# Patient Record
Sex: Female | Born: 1975 | Race: Black or African American | Hispanic: No | Marital: Single | Smoking: Never smoker
Health system: Southern US, Community
[De-identification: ages and names within clinical notes are randomized; demographics above are authoritative.]

## PROBLEM LIST (undated history)

## (undated) DIAGNOSIS — R252 Cramp and spasm: Secondary | ICD-10-CM

## (undated) DIAGNOSIS — L309 Dermatitis, unspecified: Secondary | ICD-10-CM

## (undated) DIAGNOSIS — K219 Gastro-esophageal reflux disease without esophagitis: Secondary | ICD-10-CM

## (undated) DIAGNOSIS — D649 Anemia, unspecified: Secondary | ICD-10-CM

## (undated) DIAGNOSIS — G629 Polyneuropathy, unspecified: Secondary | ICD-10-CM

## (undated) DIAGNOSIS — R87629 Unspecified abnormal cytological findings in specimens from vagina: Secondary | ICD-10-CM

## (undated) DIAGNOSIS — M2142 Flat foot [pes planus] (acquired), left foot: Secondary | ICD-10-CM

## (undated) DIAGNOSIS — R569 Unspecified convulsions: Secondary | ICD-10-CM

## (undated) DIAGNOSIS — M2141 Flat foot [pes planus] (acquired), right foot: Secondary | ICD-10-CM

## (undated) DIAGNOSIS — M545 Low back pain, unspecified: Secondary | ICD-10-CM

## (undated) DIAGNOSIS — I10 Essential (primary) hypertension: Secondary | ICD-10-CM

## (undated) DIAGNOSIS — F419 Anxiety disorder, unspecified: Secondary | ICD-10-CM

## (undated) DIAGNOSIS — T7840XA Allergy, unspecified, initial encounter: Secondary | ICD-10-CM

## (undated) DIAGNOSIS — M1990 Unspecified osteoarthritis, unspecified site: Secondary | ICD-10-CM

## (undated) HISTORY — DX: Gastro-esophageal reflux disease without esophagitis: K21.9

## (undated) HISTORY — DX: Unspecified convulsions: R56.9

## (undated) HISTORY — DX: Cramp and spasm: R25.2

## (undated) HISTORY — DX: Low back pain: M54.5

## (undated) HISTORY — DX: Unspecified osteoarthritis, unspecified site: M19.90

## (undated) HISTORY — DX: Dermatitis, unspecified: L30.9

## (undated) HISTORY — DX: Low back pain, unspecified: M54.50

## (undated) HISTORY — DX: Flat foot (pes planus) (acquired), right foot: M21.41

## (undated) HISTORY — DX: Polyneuropathy, unspecified: G62.9

## (undated) HISTORY — PX: BREAST BIOPSY: SHX20

## (undated) HISTORY — PX: ABDOMINAL HYSTERECTOMY: SHX81

## (undated) HISTORY — DX: Unspecified abnormal cytological findings in specimens from vagina: R87.629

## (undated) HISTORY — DX: Allergy, unspecified, initial encounter: T78.40XA

## (undated) HISTORY — PX: SPINE SURGERY: SHX786

## (undated) HISTORY — PX: LAPAROSCOPIC TOTAL HYSTERECTOMY: SUR800

## (undated) HISTORY — DX: Anemia, unspecified: D64.9

## (undated) HISTORY — DX: Anxiety disorder, unspecified: F41.9

## (undated) HISTORY — DX: Flat foot (pes planus) (acquired), right foot: M21.42

## (undated) HISTORY — PX: TOTAL ABDOMINAL HYSTERECTOMY: SHX209

## (undated) HISTORY — DX: Essential (primary) hypertension: I10

---

## 1998-12-05 ENCOUNTER — Emergency Department (HOSPITAL_COMMUNITY): Admission: EM | Admit: 1998-12-05 | Discharge: 1998-12-05 | Payer: Self-pay | Admitting: Emergency Medicine

## 1999-09-01 ENCOUNTER — Emergency Department (HOSPITAL_COMMUNITY): Admission: EM | Admit: 1999-09-01 | Discharge: 1999-09-01 | Payer: Self-pay | Admitting: Emergency Medicine

## 1999-09-01 ENCOUNTER — Encounter: Payer: Self-pay | Admitting: Emergency Medicine

## 1999-09-14 ENCOUNTER — Encounter: Admission: RE | Admit: 1999-09-14 | Discharge: 1999-09-14 | Payer: Self-pay | Admitting: Hematology and Oncology

## 1999-09-28 ENCOUNTER — Encounter: Admission: RE | Admit: 1999-09-28 | Discharge: 1999-09-28 | Payer: Self-pay | Admitting: Hematology and Oncology

## 1999-10-12 ENCOUNTER — Encounter: Admission: RE | Admit: 1999-10-12 | Discharge: 1999-10-12 | Payer: Self-pay | Admitting: Internal Medicine

## 1999-11-11 ENCOUNTER — Encounter: Admission: RE | Admit: 1999-11-11 | Discharge: 1999-11-11 | Payer: Self-pay | Admitting: Internal Medicine

## 1999-12-01 ENCOUNTER — Encounter: Payer: Self-pay | Admitting: Emergency Medicine

## 1999-12-01 ENCOUNTER — Emergency Department (HOSPITAL_COMMUNITY): Admission: EM | Admit: 1999-12-01 | Discharge: 1999-12-01 | Payer: Self-pay

## 1999-12-02 ENCOUNTER — Encounter: Admission: RE | Admit: 1999-12-02 | Discharge: 1999-12-02 | Payer: Self-pay | Admitting: Hematology and Oncology

## 2002-01-11 ENCOUNTER — Emergency Department (HOSPITAL_COMMUNITY): Admission: EM | Admit: 2002-01-11 | Discharge: 2002-01-11 | Payer: Self-pay | Admitting: Emergency Medicine

## 2002-01-11 ENCOUNTER — Encounter: Payer: Self-pay | Admitting: Emergency Medicine

## 2002-11-15 ENCOUNTER — Emergency Department (HOSPITAL_COMMUNITY): Admission: EM | Admit: 2002-11-15 | Discharge: 2002-11-16 | Payer: Self-pay | Admitting: Emergency Medicine

## 2004-04-11 ENCOUNTER — Emergency Department (HOSPITAL_COMMUNITY): Admission: EM | Admit: 2004-04-11 | Discharge: 2004-04-11 | Payer: Self-pay | Admitting: Emergency Medicine

## 2005-02-03 ENCOUNTER — Ambulatory Visit: Payer: Self-pay | Admitting: Family Medicine

## 2005-03-09 ENCOUNTER — Ambulatory Visit: Payer: Self-pay | Admitting: Family Medicine

## 2005-03-14 ENCOUNTER — Ambulatory Visit: Payer: Self-pay | Admitting: Family Medicine

## 2005-07-13 ENCOUNTER — Emergency Department (HOSPITAL_COMMUNITY): Admission: EM | Admit: 2005-07-13 | Discharge: 2005-07-13 | Payer: Self-pay | Admitting: *Deleted

## 2005-07-23 ENCOUNTER — Emergency Department (HOSPITAL_COMMUNITY): Admission: EM | Admit: 2005-07-23 | Discharge: 2005-07-23 | Payer: Self-pay | Admitting: Emergency Medicine

## 2006-08-30 ENCOUNTER — Ambulatory Visit: Payer: Self-pay | Admitting: Family Medicine

## 2006-08-31 ENCOUNTER — Ambulatory Visit: Payer: Self-pay | Admitting: *Deleted

## 2007-01-31 ENCOUNTER — Encounter (INDEPENDENT_AMBULATORY_CARE_PROVIDER_SITE_OTHER): Payer: Self-pay | Admitting: *Deleted

## 2007-04-27 ENCOUNTER — Telehealth (INDEPENDENT_AMBULATORY_CARE_PROVIDER_SITE_OTHER): Payer: Self-pay | Admitting: *Deleted

## 2007-05-09 ENCOUNTER — Ambulatory Visit: Payer: Self-pay | Admitting: Nurse Practitioner

## 2007-05-09 DIAGNOSIS — M545 Low back pain, unspecified: Secondary | ICD-10-CM | POA: Insufficient documentation

## 2007-05-09 DIAGNOSIS — K219 Gastro-esophageal reflux disease without esophagitis: Secondary | ICD-10-CM | POA: Insufficient documentation

## 2007-05-09 DIAGNOSIS — L259 Unspecified contact dermatitis, unspecified cause: Secondary | ICD-10-CM | POA: Insufficient documentation

## 2007-05-09 DIAGNOSIS — G589 Mononeuropathy, unspecified: Secondary | ICD-10-CM | POA: Insufficient documentation

## 2007-05-09 LAB — CONVERTED CEMR LAB
Bilirubin Urine: NEGATIVE
Blood in Urine, dipstick: NEGATIVE
Glucose, Urine, Semiquant: NEGATIVE
Ketones, urine, test strip: NEGATIVE
Nitrite: NEGATIVE
Protein, U semiquant: NEGATIVE
Specific Gravity, Urine: >=1.03
Urobilinogen, UA: 0.2
pH: 5

## 2007-06-18 ENCOUNTER — Ambulatory Visit: Payer: Self-pay | Admitting: Nurse Practitioner

## 2007-06-18 DIAGNOSIS — R252 Cramp and spasm: Secondary | ICD-10-CM | POA: Insufficient documentation

## 2007-06-18 DIAGNOSIS — J45909 Unspecified asthma, uncomplicated: Secondary | ICD-10-CM | POA: Insufficient documentation

## 2007-06-18 LAB — CONVERTED CEMR LAB
Albumin: 4.4 g/dL (ref 3.5–5.2)
Alkaline Phosphatase: 58 units/L (ref 39–117)
BUN: 10 mg/dL (ref 6–23)
CO2: 21 meq/L (ref 19–32)
Calcium: 9.2 mg/dL (ref 8.4–10.5)
Chloride: 105 meq/L (ref 96–112)
Eosinophils Absolute: 0.2 10*3/uL (ref 0.0–0.7)
Glucose, Bld: 105 mg/dL — ABNORMAL HIGH (ref 70–99)
Lymphs Abs: 1.9 10*3/uL (ref 0.7–4.0)
MCV: 85.3 fL (ref 78.0–100.0)
Monocytes Relative: 6 % (ref 3–12)
Neutrophils Relative %: 51 % (ref 43–77)
Potassium: 4.2 meq/L (ref 3.5–5.3)
RBC: 4.62 M/uL (ref 3.87–5.11)
TSH: 2.15 microintl units/mL (ref 0.350–5.50)
WBC: 4.9 10*3/uL (ref 4.0–10.5)

## 2007-06-19 ENCOUNTER — Encounter (INDEPENDENT_AMBULATORY_CARE_PROVIDER_SITE_OTHER): Payer: Self-pay | Admitting: Nurse Practitioner

## 2007-07-03 ENCOUNTER — Telehealth (INDEPENDENT_AMBULATORY_CARE_PROVIDER_SITE_OTHER): Payer: Self-pay | Admitting: Nurse Practitioner

## 2007-07-04 ENCOUNTER — Ambulatory Visit: Payer: Self-pay | Admitting: Family Medicine

## 2007-07-04 LAB — CONVERTED CEMR LAB
Bilirubin Urine: NEGATIVE
Glucose, Urine, Semiquant: NEGATIVE
Specific Gravity, Urine: 1.025
pH: 7

## 2007-07-05 ENCOUNTER — Encounter (INDEPENDENT_AMBULATORY_CARE_PROVIDER_SITE_OTHER): Payer: Self-pay | Admitting: Family Medicine

## 2007-08-20 ENCOUNTER — Telehealth (INDEPENDENT_AMBULATORY_CARE_PROVIDER_SITE_OTHER): Payer: Self-pay | Admitting: *Deleted

## 2007-08-22 ENCOUNTER — Ambulatory Visit: Payer: Self-pay | Admitting: Family Medicine

## 2007-08-22 LAB — CONVERTED CEMR LAB
Bilirubin Urine: NEGATIVE
Glucose, Urine, Semiquant: NEGATIVE
Specific Gravity, Urine: >=1.03
WBC Urine, dipstick: NEGATIVE
pH: 5

## 2007-08-28 ENCOUNTER — Ambulatory Visit: Payer: Self-pay | Admitting: Family Medicine

## 2007-08-28 DIAGNOSIS — E669 Obesity, unspecified: Secondary | ICD-10-CM | POA: Insufficient documentation

## 2007-08-28 DIAGNOSIS — R609 Edema, unspecified: Secondary | ICD-10-CM | POA: Insufficient documentation

## 2007-09-04 ENCOUNTER — Ambulatory Visit: Payer: Self-pay | Admitting: Family Medicine

## 2007-11-29 ENCOUNTER — Ambulatory Visit: Payer: Self-pay | Admitting: Nurse Practitioner

## 2007-11-29 DIAGNOSIS — M2140 Flat foot [pes planus] (acquired), unspecified foot: Secondary | ICD-10-CM | POA: Insufficient documentation

## 2007-11-29 LAB — CONVERTED CEMR LAB: Sed Rate: 11 mm/hr (ref 0–22)

## 2007-11-30 ENCOUNTER — Encounter (INDEPENDENT_AMBULATORY_CARE_PROVIDER_SITE_OTHER): Payer: Self-pay | Admitting: Nurse Practitioner

## 2008-04-28 ENCOUNTER — Encounter (INDEPENDENT_AMBULATORY_CARE_PROVIDER_SITE_OTHER): Payer: Self-pay | Admitting: Family Medicine

## 2008-04-28 ENCOUNTER — Ambulatory Visit: Payer: Self-pay | Admitting: Family Medicine

## 2008-04-28 LAB — CONVERTED CEMR LAB
Blood in Urine, dipstick: NEGATIVE
Ketones, urine, test strip: NEGATIVE
Nitrite: NEGATIVE
Protein, U semiquant: NEGATIVE
pH: 5

## 2008-04-29 ENCOUNTER — Encounter (INDEPENDENT_AMBULATORY_CARE_PROVIDER_SITE_OTHER): Payer: Self-pay | Admitting: Family Medicine

## 2008-04-29 LAB — CONVERTED CEMR LAB: GC Probe Amp, Genital: NEGATIVE

## 2008-05-10 ENCOUNTER — Encounter (INDEPENDENT_AMBULATORY_CARE_PROVIDER_SITE_OTHER): Payer: Self-pay | Admitting: Family Medicine

## 2009-01-12 ENCOUNTER — Ambulatory Visit: Payer: Self-pay | Admitting: Nurse Practitioner

## 2009-01-12 LAB — CONVERTED CEMR LAB
Cholesterol, target level: 200 mg/dL
LDL Goal: 160 mg/dL

## 2009-01-13 ENCOUNTER — Encounter (INDEPENDENT_AMBULATORY_CARE_PROVIDER_SITE_OTHER): Payer: Self-pay | Admitting: Nurse Practitioner

## 2009-01-13 LAB — CONVERTED CEMR LAB
ALT: 42 units/L — ABNORMAL HIGH (ref 0–35)
AST: 39 units/L — ABNORMAL HIGH (ref 0–37)
Alkaline Phosphatase: 68 units/L (ref 39–117)
Basophils Absolute: 0 10*3/uL (ref 0.0–0.1)
Basophils Relative: 1 % (ref 0–1)
CO2: 25 meq/L (ref 19–32)
Creatinine, Ser: 0.79 mg/dL (ref 0.40–1.20)
Eosinophils Absolute: 0.1 10*3/uL (ref 0.0–0.7)
HCV Ab: NEGATIVE
Hep A Total Ab: NEGATIVE
Hep B E Ab: NEGATIVE
MCHC: 31.2 g/dL (ref 30.0–36.0)
MCV: 85.3 fL (ref 78.0–100.0)
Monocytes Relative: 7 % (ref 3–12)
Neutro Abs: 2.6 10*3/uL (ref 1.7–7.7)
Neutrophils Relative %: 56 % (ref 43–77)
Platelets: 249 10*3/uL (ref 150–400)
RBC: 4.55 M/uL (ref 3.87–5.11)
Sodium: 139 meq/L (ref 135–145)
TSH: 2.92 microintl units/mL (ref 0.350–4.500)
Total Bilirubin: 0.5 mg/dL (ref 0.3–1.2)
Total Protein: 6.9 g/dL (ref 6.0–8.3)

## 2009-01-21 ENCOUNTER — Encounter (INDEPENDENT_AMBULATORY_CARE_PROVIDER_SITE_OTHER): Payer: Self-pay | Admitting: Nurse Practitioner

## 2009-05-20 ENCOUNTER — Other Ambulatory Visit: Admission: RE | Admit: 2009-05-20 | Discharge: 2009-05-20 | Payer: Self-pay | Admitting: Internal Medicine

## 2009-05-20 ENCOUNTER — Ambulatory Visit: Payer: Self-pay | Admitting: Nurse Practitioner

## 2009-05-20 LAB — CONVERTED CEMR LAB
ALT: 21 units/L (ref 0–35)
AST: 25 units/L (ref 0–37)
CO2: 21 meq/L (ref 19–32)
Chloride: 105 meq/L (ref 96–112)
Cholesterol: 187 mg/dL (ref 0–200)
GC Probe Amp, Genital: NEGATIVE
Glucose, Urine, Semiquant: NEGATIVE
LDL Cholesterol: 129 mg/dL — ABNORMAL HIGH (ref 0–99)
Sodium: 141 meq/L (ref 135–145)
Total Bilirubin: 0.3 mg/dL (ref 0.3–1.2)
Total Protein: 6.5 g/dL (ref 6.0–8.3)
Urobilinogen, UA: 0.2
VLDL: 14 mg/dL (ref 0–40)
WBC Urine, dipstick: NEGATIVE
pH: 5

## 2009-05-21 ENCOUNTER — Encounter (INDEPENDENT_AMBULATORY_CARE_PROVIDER_SITE_OTHER): Payer: Self-pay | Admitting: Nurse Practitioner

## 2009-06-23 ENCOUNTER — Telehealth (INDEPENDENT_AMBULATORY_CARE_PROVIDER_SITE_OTHER): Payer: Self-pay | Admitting: Nurse Practitioner

## 2009-06-29 DIAGNOSIS — R569 Unspecified convulsions: Secondary | ICD-10-CM

## 2009-06-29 HISTORY — DX: Unspecified convulsions: R56.9

## 2009-06-30 ENCOUNTER — Emergency Department (HOSPITAL_COMMUNITY): Admission: EM | Admit: 2009-06-30 | Discharge: 2009-07-01 | Payer: Self-pay | Admitting: Emergency Medicine

## 2009-07-02 ENCOUNTER — Telehealth (INDEPENDENT_AMBULATORY_CARE_PROVIDER_SITE_OTHER): Payer: Self-pay | Admitting: Internal Medicine

## 2009-07-02 ENCOUNTER — Encounter (INDEPENDENT_AMBULATORY_CARE_PROVIDER_SITE_OTHER): Payer: Self-pay | Admitting: Nurse Practitioner

## 2009-07-02 ENCOUNTER — Ambulatory Visit: Payer: Self-pay | Admitting: Internal Medicine

## 2009-07-02 DIAGNOSIS — M76899 Other specified enthesopathies of unspecified lower limb, excluding foot: Secondary | ICD-10-CM | POA: Insufficient documentation

## 2009-07-03 ENCOUNTER — Telehealth (INDEPENDENT_AMBULATORY_CARE_PROVIDER_SITE_OTHER): Payer: Self-pay | Admitting: Internal Medicine

## 2009-07-03 ENCOUNTER — Ambulatory Visit (HOSPITAL_COMMUNITY): Admission: RE | Admit: 2009-07-03 | Discharge: 2009-07-03 | Payer: Self-pay | Admitting: Internal Medicine

## 2009-07-07 ENCOUNTER — Telehealth (INDEPENDENT_AMBULATORY_CARE_PROVIDER_SITE_OTHER): Payer: Self-pay | Admitting: Internal Medicine

## 2009-08-07 ENCOUNTER — Telehealth (INDEPENDENT_AMBULATORY_CARE_PROVIDER_SITE_OTHER): Payer: Self-pay | Admitting: *Deleted

## 2009-08-27 ENCOUNTER — Telehealth (INDEPENDENT_AMBULATORY_CARE_PROVIDER_SITE_OTHER): Payer: Self-pay | Admitting: Nurse Practitioner

## 2009-09-08 ENCOUNTER — Encounter (INDEPENDENT_AMBULATORY_CARE_PROVIDER_SITE_OTHER): Payer: Self-pay | Admitting: *Deleted

## 2009-09-22 ENCOUNTER — Telehealth (INDEPENDENT_AMBULATORY_CARE_PROVIDER_SITE_OTHER): Payer: Self-pay | Admitting: Nurse Practitioner

## 2010-02-15 ENCOUNTER — Emergency Department (HOSPITAL_COMMUNITY): Admission: EM | Admit: 2010-02-15 | Discharge: 2010-02-15 | Payer: Self-pay | Admitting: Emergency Medicine

## 2010-04-28 ENCOUNTER — Encounter (INDEPENDENT_AMBULATORY_CARE_PROVIDER_SITE_OTHER): Payer: Self-pay | Admitting: Nurse Practitioner

## 2010-05-13 ENCOUNTER — Telehealth (INDEPENDENT_AMBULATORY_CARE_PROVIDER_SITE_OTHER): Payer: Self-pay | Admitting: Nurse Practitioner

## 2010-05-14 ENCOUNTER — Ambulatory Visit
Admission: RE | Admit: 2010-05-14 | Discharge: 2010-05-14 | Payer: Self-pay | Source: Home / Self Care | Attending: Nurse Practitioner | Admitting: Nurse Practitioner

## 2010-05-14 LAB — CONVERTED CEMR LAB
Glucose, Urine, Semiquant: NEGATIVE
Nitrite: NEGATIVE
Specific Gravity, Urine: >=1.03
WBC Urine, dipstick: NEGATIVE
pH: 6.5

## 2010-06-17 NOTE — Progress Notes (Signed)
Summary: MEDICINE NOT WORKING FOR HER LEGS  Medications Added REQUIP 1 MG TABS (ROPINIROLE HCL) One tablet by mouth nightly 30 minutes before sleep  **Pharmach - note dose increase**       Phone Note Call from Patient Call back at [phone] CELL   Reason for Call: Talk to Nurse Summary of Call: Linda Porter Beedle SAYS THAT THE REQUIP FOR HER LEGS ARE NOT WORKING, AND SHE IS WONDERING IF IT NEEDS TO BE A HIGHER DOSAGE OR NEEDS TO BE CHANGED TO SOMETHING ELSE. ALSO SHE SAYS HER FEET ARE GOING TO SLEEP EVEN IF SHE IS STANDING UP AND SHE WANTS TO KNOW IF THIS HAS SOMETING TO DO WITH THE MEDICINE. SHE USES [facility]. Initial call taken by: Leodis Rains,  June 23, 2009 3:54 PM  Follow-up for Phone Call        forward to N. Daphine Deutscher, FNP Follow-up by: Levon Hedger,  June 23, 2009 4:13 PM  Additional Follow-up for Phone Call Additional follow up Details #1::        will increase requip to 1mg  so she can take 2 of the tablets she has which is 0.5mg .  I have sent a new Rx for 1mg  to the pharmacy. Numbness is usually not a side effect from the medication however pt has complained of this previously so don't think this is anything new.  This is part of the reason I wanted her to get tested by neurology Additional Follow-up by: Lehman Prom FNP,  June 24, 2009 12:41 PM    Additional Follow-up for Phone Call Additional follow up Details #2::    Levon Hedger  June 25, 2009 11:40 AM Left message on machine for pt to return call to the office  pt informed of above information and would like to be referred to Neurology if the cost is not to expensive. Levon Hedger  June 25, 2009 11:54 AM   Additional Follow-up for Phone Call Additional follow up Details #3:: Details for Additional Follow-up Action Taken: neurology referral in basket fax to [facility] Additional Follow-up by: Lehman Prom FNP,  June 25, 2009 5:56 PM  New/Updated Medications: REQUIP 1 MG  TABS (ROPINIROLE HCL) One tablet by mouth nightly 30 minutes before sleep  **Pharmach - note dose increase** Prescriptions: REQUIP 1 MG TABS (ROPINIROLE HCL) One tablet by mouth nightly 30 minutes before sleep  **Pharmach - note dose increase**  #30 x 1   Entered and Authorized by:   Lehman Prom FNP   Signed by:   Lehman Prom FNP on 06/24/2009   Method used:   Faxed to ...       [facility] (retail)       [address] [place], [place]  [address]       Ph: [phone]       Fax: [phone]   RxID:   [id number]

## 2010-06-17 NOTE — Letter (Signed)
Summary: [facility] NEUROLOGY  [facility] NEUROLOGY   Imported By: Arta Bruce 05/12/2010 16:18:17  _____________________________________________________________________  External Attachment:    Type:   Image     Comment:   External Document

## 2010-06-17 NOTE — Letter (Signed)
Summary: *HSN Results Follow up  [facility]  [address] [place], [place] [address]   Phone: [phone]  Fax: [phone]      05/21/2009   Linda Porter [address] [place], [place]  [address]   Dear  [name],                            ____S.Drinkard,FNP   ____D. Gore,FNP       ____B. McPherson,MD   ____V. Rankins,MD    ____E. Mulberry,MD    _X__N. Daphine Deutscher, FNP  ____D. Reche Dixon, MD    ____K. Philipp Deputy, MD    ____Other     This letter is to inform you that your recent test(s):  __X____Pap Smear    ___X____Lab Test     _______X-ray    ___X____ is within acceptable limits  _______ requires a medication change  _______ requires a follow-up lab visit  _______ requires a follow-up visit with your provider   Comments: Labs done during recent office visit are normal.       _________________________________________________________ If you have any questions, please contact our office [phone].                    Sincerely,    Lehman Prom FNP [facility]

## 2010-06-17 NOTE — Progress Notes (Signed)
Summary: [facility] Referral (Neurology)   Phone Note Call from Patient   Summary of Call: Pt was asking about her [facility] referral //said she has still not heard anything about an appointment Initial call taken by: Arta Bruce,  August 27, 2009 12:08 PM  Follow-up for Phone Call        Hi Stanton Kidney would you check to see if you have a referral for this pt that was originated back in February.  Please contact me with an update.  Thank you  Levon Hedger  September 01, 2009 11:06 AM   Additional Follow-up for Phone Call Additional follow up Details #1::        left message for Pt to call me.Pt is sched @ [facility] on 12-08-09 @ 1:45pm.The number is [phone] Additional Follow-up by: Candi Leash,  September 07, 2009 10:49 AM    Additional Follow-up for Phone Call Additional follow up Details #2::    Levon Hedger  September 08, 2009 3:23 PM Left message on machine for pt to return call to the office. Will mail letter with information.  Additional Follow-up for Phone Call Additional follow up Details #3:: Details for Additional Follow-up Action Taken: noted Additional Follow-up by: Lehman Prom FNP,  September 08, 2009 3:47 PM

## 2010-06-17 NOTE — Progress Notes (Signed)
Summary: REFERRAL   Phone Note Call from Patient   Summary of Call: PLEASE CALL ABOUT REFERRRAL  ##[phone] Initial call taken by: Arta Bruce,  August 07, 2009 2:39 PM  Follow-up for Phone Call        Which Referral? Follow-up by: Candi Leash,  August 18, 2009 12:36 PM  Additional Follow-up for Phone Call Additional follow up Details #1::        Going back to the 07-07-09 phone note,it looks like she was to get an appointment w/[facility].this was faxed on Feb 15 and it looks like it's still on admin hold Additional Follow-up by: Arta Bruce,  August 18, 2009 3:19 PM    Additional Follow-up for Phone Call Additional follow up Details #2::    Never got this referral.will work on it today Follow-up by: Candi Leash,  August 19, 2009 8:00 AM  Additional Follow-up for Phone Call Additional follow up Details #3:: Details for Additional Follow-up Action Taken: Thanks Debra Additional Follow-up by: Arta Bruce,  August 19, 2009 10:10 AM

## 2010-06-17 NOTE — Progress Notes (Signed)
Summary: Feet swelling  Phone Note Call from Patient   Summary of Call: FFET BEEN SWELLING FOR ABOUT 3 WKS/DON'T KNOW IF IT MAT BE HER BLOOD PRESSURE AND SHE IS HAVING TROUBLE SLEEPING//PHONE #[phone] Initial call taken by: Arta Bruce,  May 13, 2010 10:46 AM  Follow-up for Phone Call        Left message on answering machines at [phone] and [phone].  Dutch Quint RN  May 13, 2010 4:44 PM  Is not currently taking any medications.  Feet are swollen usually all day when she's on her feet, is worse at night.  States that only walking decreases edema.  Elevating them makes them hurt, legs also "jump".  Went to neurologist about 3 weeks ago, needs nerve test but needs money first.  Neuro. couldn't tell her why her legs are jumping.  Not sleeping well, has trouble getting out of bed with leg pain.  Appt. scheduled 05/14/10. Follow-up by: Dutch Quint RN,  May 13, 2010 4:59 PM

## 2010-06-17 NOTE — Letter (Signed)
Summary: REFERRAL//MISC  REFERRAL//MISC   Imported By: Arta Bruce 08/25/2009 12:48:04  _____________________________________________________________________  External Attachment:    Type:   Image     Comment:   External Document

## 2010-06-17 NOTE — Letter (Signed)
Summary: *HSN Results Follow up  [facility]  [address] [place], [place] [address]   Phone: [phone]  Fax: [phone]      09/08/2009   Linda Porter [address] [place], [place]  [address]   Dear  Ms. Rennie Forrest,                            ____S.Drinkard,FNP   ____D. Gore,FNP       ____B. McPherson,MD   ____V. Rankins,MD    ____E. Mulberry,MD    ____N. Daphine Deutscher, FNP  ____D. Reche Dixon, MD    ____K. Philipp Deputy, MD    ____Other     This letter is to inform you that your recent test(s):  _______Pap Smear    _______Lab Test     _______X-ray    _______ is within acceptable limits  _______ requires a medication change  _______ requires a follow-up lab visit  _______ requires a follow-up visit with your provider   Comments:  This letter is to inform you of the appointment that we have scheduled for you at [facility] on December 08, 2009 @ 1:45.  The phone number to the office is [phone].  If you have any questions please contact the office.       _________________________________________________________ If you have any questions, please contact our office                     Sincerely,  Levon Hedger [facility]

## 2010-06-17 NOTE — Assessment & Plan Note (Signed)
Summary: martin fnp pt/ seizures///gk   Vital Signs:  Patient profile:   35 year old female Menstrual status:  regular Weight:      218 pounds Temp:     98.6 degrees F Pulse rhythm:   regular Resp:     74 per minute BP sitting:   119 / 69  (left arm) Cuff size:   large  Vitals Entered By: Vesta Mixer CMA (July 02, 2009 4:02 PM) CC: f/u seizure had it the night before last.  Micah Flesher to ED for it not exactly sure if it was a seizure or what. Is Patient Diabetic? No Pain Assessment Patient in pain? yes     Location: legs Intensity: 9  Does patient need assistance? Ambulation Normal   Primary Care Provider:  Rankins  CC:  f/u seizure had it the night before last.  Micah Flesher to ED for it not exactly sure if it was a seizure or what..  History of Present Illness: 2.  Leg cramps/muscle spasms w/ possible seizure on 2/15: Has had foot cramps even as a child--would extend up legs.  Describes symptoms during day more as muscles "jumping", but when feet cramp, develops cramp of legs.  At night or when stands on feet for any length of time, has numbness in lateral left foot up lateral and posterior  aspect of lower leg . When awakens at night--numbness and tingling seems to involve entire left leg--cannot isolate a certain area--feeling comes back after walks around a bit.  No back pain associated with this recently, but has had low back pain in past.  No definite injury to back.  Does not do any regular lifting of anything heavy.  Has been down to 180 with weight in past and has not noted an improvement in discomforts.  Has not had low back Xrayed.   Has had physical therapy --had ultrasound and TENS unit, but never seemed to help.  Has had orthotics for shoes--never really seemed to help. Has used muscle relaxers before for back, but not really for legs.  Was started on Requip beginning of January at [facility].  Did not feel it was helping and dose was increased to 0.5 mg 2 weeks ago.  Developed lip  and eyelid twitching since increase in the dose.  Chronically, does not sleep well.  Has never, however, had the twitching before when tired--eyelid has twitched when angry before--but rare.   The evening of 06/30/09--nauseated from medicine.  Went into bathroom.  Legs were really hurting and twitchin/shaking--grabbed shower curtain and fell to floor.  Does not remember anything for about 1 minute.  Boyfriend stated she was shaking all over and eyes were rolled back to ED personnel, but no jaw clenching, tongue biting, no fecal or urine incontinence.  Pt. feels she was alert immediately upon awakening. Has passed out similarly before with pain when was pregnant--same type of pain--14 years ago.  Has not had that significant of pain until this last episode.  Had labs and CT of head and Cspine in ED on 2/15 that were normal.Dr. Pearlean Brownie was apparently consulted with this--by ED notes appears to have been phone consultation.  Recommended stopping Requip and following up with PCP and possibly Neuro if indicated.  Pt. has appt. scheduled with Dr. Pearlean Brownie end of March already. Of note, final documentation does states first episode witnessed seizure likely secondary to medication change.  Also with dystonic movements of lips and eyelids that reportedly improved with Benadryl in ED.  Pt. told  to not drive for 6 months.  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  Allergies (verified): 1)  ! Epidural  Physical Exam  General:  NAD Eyes:  No corneal or conjunctival inflammation noted. EOMI. Perrla. Funduscopic exam benign, without hemorrhages, exudates or papilledema. Vision grossly normal. Msk:  Tremulousness of lower extremities below knees with straight leg raise Point tenderness over left greater trochanter--reproduces some of the pins and needles type pain down left leg. Extremities:  See below in Neuro regarding knee Valgus and pes planus Neurologic:  alert & oriented X3, cranial nerves  II-XII intact, strength normal in all extremities, sensation intact to light touch, gait normal, and DTRs symmetrical and normal, thought with Valgus deformity at knees and significant pes planus bilaterally.     Impression & Recommendations:  Problem # 1:  SYNCOPAL EPISODE, POSSIBLE SEIZURE (ICD-780.2) With significant history of possible neuromuscular type complaints. Discussed situation with Dr. Lynnea Ferrier getting her evaluated for neuromuscular disoreder with EMG and NCS--there is some question as to whether pt. has had an evaluation in the past, but unable to obtaine paper chart to see. Possibly seen at [facility] a few years ago, but pt. states she has not.  Is being referred by her PCP--N. Daphine Deutscher.   However, because pt. did have LOC, she cannot by [place] law drive for 6 months. Dr. Vickey Huger recommends scheduling for EEG tomorrow morning at [facility] [phone]--to fax today's record to the EEG lab. She will be in hospital tomorrow morning until 9 a.m. Normal EEG will, however, not change recommendation to not drive for 6 months  Problem # 2:  BACK PAIN, LUMBAR (ICD-724.2) Feel her leg burning, numbness and tingling more likely secondary to a greater trochanteric bursitis, but uncertain what is causing the left foot symptoms and the latter is in the L5/S1 distribution--check low back films Orders: Diagnostic X-Ray/Fluoroscopy (Diagnostic X-Ray/Flu)  Problem # 3:  TROCHANTERIC BURSITIS, LEFT (ICD-726.5) consider having return for injection once some of the more important issues are sorted out--would like to see back Xray first as well.  Complete Medication List: 1)  Proventil Hfa 108 (90 Base) Mcg/act Aers (Albuterol sulfate) .... 2 puffs every 6 hours as needed for shortness of breath 2)  Triamcinolone Acetonide 0.025 % Crea (Triamcinolone acetonide) .... Apply to rash and rub in well two times a day 3)  Furosemide 20 Mg Tabs (Furosemide) .... One tablet by mouth daily as  needed for fluid 4)  Zantac 150 Mg Tabs (Ranitidine hcl) .... One tablet by mouth two times a day (30 mintues before breakfast and dinner) 5)  Requip 1 Mg Tabs (Ropinirole hcl) .... One tablet by mouth nightly 30 minutes before sleep  **pharmach - note dose increase**

## 2010-06-17 NOTE — Letter (Signed)
Summary: Linda Porter [facility]//OUTPATIENT CHEMISTRY RESULT  [place] [facility]//OUTPATIENT CHEMISTRY RESULT   Imported By: Arta Bruce 05/12/2010 16:55:13  _____________________________________________________________________  External Attachment:    Type:   Image     Comment:   External Document
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
## 2010-06-17 NOTE — Progress Notes (Signed)
Summary: Office Visit/DEPRESSION SCREENING  Office Visit/DEPRESSION SCREENING   Imported By: Arta Bruce 07/29/2009 15:16:45  _____________________________________________________________________  External Attachment:    Type:   Image     Comment:   External Document

## 2010-06-17 NOTE — Assessment & Plan Note (Signed)
Summary: Complete Physical Exam   Vital Signs:  Patient profile:   35 year old female Menstrual status:  regular LMP:     05/18/2009 Weight:      223.1 pounds BMI:     37.26 BSA:     2.07 O2 Sat:      99 % on Room air Temp:     98.4 degrees F oral Pulse rate:   88 / minute Pulse rhythm:   regular Resp:     20 per minute BP sitting:   122 / 76  (left arm) Cuff size:   large  Vitals Entered By: Levon Hedger (May 20, 2009 9:47 AM)  O2 Flow:  Room air CC: CPP Is Patient Diabetic? No Pain Assessment Patient in pain? no       Does patient need assistance? Functional Status Self care Ambulation Normal Comments pt states she is not taking any medications daily. LMP (date): 05/18/2009 LMP - Character: normal    Menses interval (days): 28 Menstrual flow (days): 5 Menstrual Status regular Enter LMP: 05/18/2009 Last PAP Result NEGATIVE FOR INTRAEPITHELIAL LESIONS OR MALIGNANCY.         Serial Vital Signs/Assessments:  Comments: P/F  300,  400,  400 By: Levon Hedger    Primary Care Provider:  [facility]  CC:  CPP.  History of Present Illness:  Pt into the office for a complete physical exam  PAP - last done 1 year ago All normal PAP in the past 1 child age 35, stable relationship for past 10 years Menses every month, no current contraception no family hx of cervial or ovarian cancer  Mammogram - none. No family hx of breast cancer on father side but mother was adopted. no self breast exams at home  Optho - last eye exam was 2 years ago. She wears contacts. she is aware that she needs an eye exam  Dental - last dental appt was about 8 months ago. no current problems with teeth.  Asthma History    Initial Asthma Severity Rating:    Age range: 12+ years    Symptoms: 0-2 days/week    Nighttime Awakenings: 0-2/month    Interferes w/ normal activity: no limitations    SABA use (not for EIB): 0-2 days/week    Exacerbations requiring oral systemic  steroids: 0-1/year    Asthma Severity Assessment: Intermittent    Habits & Providers  Alcohol-Tobacco-Diet     Alcohol drinks/day: 0     Tobacco Status: never  Exercise-Depression-Behavior     Does Patient Exercise: yes     Type of exercise: treadmill,situps     Times/week: 4     Have you felt down or hopeless? yes     Have you felt little pleasure in things? yes     Depression Counseling: not indicated; screening negative for depression     Drug Use: no     Seat Belt Use: 100     Sun Exposure: occasionally  Comments: PHQ- 9 score = 7  Allergies (verified): 1)  ! Epidural  Review of Systems General:  Denies fever. Eyes:  Denies blurring. ENT:  Denies earache. CV:  Denies chest pain or discomfort. Resp:  Denies cough. GI:  Denies abdominal pain, nausea, and vomiting. GU:  Denies discharge. MS:  Complains of muscle and cramps; denies joint pain; right leg, mostly at night. Derm:  Denies rash. Neuro:  Denies headaches. Psych:  Denies anxiety and depression.  Physical Exam  General:  alert.  Head:  normocephalic.   Eyes:  vision grossly intact, pupils equal, and pupils round.   Ears:  moderate cerumen impacation Nose:  no nasal discharge.   Mouth:  pharynx pink and moist and fair dentition.   Neck:  supple.   Chest Wall:  no deformities.   Breasts:  skin/areolae normal and no masses.  dense breast tissue, tender Lungs:  normal breath sounds.   Heart:  normal rate and regular rhythm.   Abdomen:  soft, non-tender, and normal bowel sounds.   Rectal:  no external abnormalities.   Msk:  up to the exam table pes planus Pulses:  R radial normal, R dorsalis pedis normal, L radial normal, and L dorsalis pedis normal.   Extremities:  no edema Neurologic:  alert & oriented X3, cranial nerves II-XII intact, and gait normal.   Skin:  color normal.   tattoo across upper back, left lower extremity Psych:  Oriented X3 and good eye contact.    Pelvic Exam  Vulva:       normal appearance.   Urethra and Bladder:      Urethra--normal.  Bladder--normal.   Vagina:      physiologic discharge.  scant bloody discharge Cervix:      midposition, parous.   Uterus:      smooth.   Adnexa:      nontender bilaterally.   Rectum:      normal.      Impression & Recommendations:  Problem # 1:  ROUTINE GYNECOLOGICAL EXAMINATION (ICD-V72.31) Pap done lipids done rec optho exam rec dental exam self breast exams at home PHQ-9 score = 7 Orders: UA Dipstick w/o Micro (manual) (16109) Rapid HIV  (92370) T-Lipid Profile [phone]) T- GC Chlamydia (91478) KOH/ WET Mount [phone]) Pap Smear, Thin Prep (13086)  Problem # 2:  ASTHMA (ICD-493.90) stable MDI as needed  Her updated medication list for this problem includes:    Proventil Hfa 108 (90 Base) Mcg/act Aers (Albuterol sulfate) .Marland Kitchen... 2 puffs every 6 hours as needed for shortness of breath  Orders: Pulse Oximetry (single measurment) (94760) Peak Flow Rate (94150)  Problem # 3:  LEG CRAMPS, NOCTURNAL (ICD-729.82) will try requip  Complete Medication List: 1)  Proventil Hfa 108 (90 Base) Mcg/act Aers (Albuterol sulfate) .... 2 puffs every 6 hours as needed for shortness of breath 2)  Triamcinolone Acetonide 0.025 % Crea (Triamcinolone acetonide) .... Apply to rash and rub in well two times a day 3)  Furosemide 20 Mg Tabs (Furosemide) .... One tablet by mouth daily as needed for fluid 4)  Zantac 150 Mg Tabs (Ranitidine hcl) .... One tablet by mouth two times a day (30 mintues before breakfast and dinner) 5)  Requip 0.5 Mg Tabs (Ropinirole hcl) .... One tablet by mouth 30 minutes before hour of sleep  Other Orders: T-Comprehensive Metabolic Panel ([id number])  Asthma Management Plan    Asthma Severity: Intermittent    Personal best PEF: 400 liters/minute    Predicted PEF: 487 liters/minute    Working PEF: 400 liters/minute    Plan based on PEF formula: [place] [employer] Zone: (Range: 320 to  400) PROVENTIL HFA 108 (90 BASE) MCG/ACT AERS  Yellow Zone:  Red Zone:  Patient Instructions: 1)  You will be informed of any abnormal labs. 2)  Leg cramps - Requip will be faxed to [facility] pharmacy. Take this 30 minutes before you go to sleep 3)  Follow up as needed Prescriptions: REQUIP 0.5 MG TABS (ROPINIROLE HCL) One tablet  by mouth 30 minutes before hour of sleep  #30 x 1   Entered and Authorized by:   Lehman Prom FNP   Signed by:   Lehman Prom FNP on 05/20/2009   Method used:   Faxed to ...       [facility] (retail)       [address] [place], [place]  [address]       Ph: [phone]       Fax: [phone]   RxID:   [id number]   Laboratory Results   Urine Tests  Date/Time Received: May 20, 2009 9:59 AM  Date/Time Reported: May 20, 2009 9:59 AM   Routine Urinalysis   Color: Linda Porter Appearance: Cloudy Glucose: negative   (Normal Range: Negative) Bilirubin: small   (Normal Range: Negative) Ketone: trace (5)   (Normal Range: Negative) Spec. Gravity: >=1.030   (Normal Range: 1.003-1.035) Blood: moderate   (Normal Range: Negative) pH: 5.0   (Normal Range: 5.0-8.0) Protein: 30   (Normal Range: Negative) Urobilinogen: 0.2   (Normal Range: 0-1) Nitrite: negative   (Normal Range: Negative) Leukocyte Esterace: negative   (Normal Range: Negative)    Comments: pt just off her menses Date/Time Received: May 20, 2009 .time  Wet Mount/KOH Source: vaginal WBC/hpf: 1-5 Bacteria/hpf: rare Clue cells/hpf: none Yeast/hpf: none Trichomonas/hpf: none    Prevention & Chronic Care Immunizations   Influenza vaccine: Not documented   Influenza vaccine deferral: Refused  (05/20/2009)    Tetanus booster: 01/12/2009: Tdap    Pneumococcal vaccine: Not documented  Other Screening   Pap smear: NEGATIVE FOR INTRAEPITHELIAL LESIONS OR MALIGNANCY.  (04/28/2008)   Pap smear action/deferral: Ordered   (05/20/2009)   Smoking status: never  (05/20/2009)  Lipids   Total Cholesterol: Not documented   Lipid panel action/deferral: Lipid Panel ordered   LDL: Not documented   LDL Direct: Not documented   HDL: Not documented   Triglycerides: Not documented  Laboratory Results   Urine Tests    Routine Urinalysis   Color: Linda Porter Appearance: Cloudy Glucose: negative   (Normal Range: Negative) Bilirubin: small   (Normal Range: Negative) Ketone: trace (5)   (Normal Range: Negative) Spec. Gravity: >=1.030   (Normal Range: 1.003-1.035) Blood: moderate   (Normal Range: Negative) pH: 5.0   (Normal Range: 5.0-8.0) Protein: 30   (Normal Range: Negative) Urobilinogen: 0.2   (Normal Range: 0-1) Nitrite: negative   (Normal Range: Negative) Leukocyte Esterace: negative   (Normal Range: Negative)    Comments: pt just off her menses   DIRECTV KOH: Negative

## 2010-06-17 NOTE — Assessment & Plan Note (Signed)
Summary: Leg Swelling   Vital Signs:  Patient profile:   35 year old female Menstrual status:  regular LMP:     04/2010 Weight:      222.8 pounds BMI:     37.21 Temp:     98.3 degrees F oral Pulse rate:   72 / minute Pulse rhythm:   regular Resp:     16 per minute BP sitting:   120 / 84  (left arm) Cuff size:   large  Vitals Entered By: Levon Hedger (May 14, 2010 12:28 PM)  Nutrition Counseling: Patient's BMI is greater than 25 and therefore counseled on weight management options. CC: feet swelling x 3 weeks with pain Is Patient Diabetic? No Pain Assessment Patient in pain? yes     Location: feet Intensity: 7  Does patient need assistance? Functional Status Self care Ambulation Normal LMP (date): 04/2010 LMP - Character: normal    Menses interval (days): 28 Menstrual flow (days): 5 Enter LMP: 04/2010 Last PAP Result  Specimen Adequacy: Satisfactory for evaluation.   Interpretation/Result:Negative for intraepithelial Lesion or Malignancy.   Interpretation/Result:Reactive Changes.     Primary Care Provider:  [facility]  CC:  feet swelling x 3 weeks with pain.  History of Present Illness:  Pt into the office with c/o swollen legs Started 2 weeks ago and swelling has been constant. Swelling is present even upon waking in the morning. Difficulty walking due to pain. Denies any change in diet. Menses - occurs the same time every month.    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 She has been cleaning for the past 3 months from 9:30-12:30.    Allergies (verified): 1)  ! Epidural  Review of Systems CV:  Complains of swelling of feet; denies chest pain or discomfort and shortness of breath with exertion. Resp:  Denies chest discomfort, cough, and shortness of breath. GI:  Denies abdominal pain, nausea, and vomiting. GU:  Complains of urinary frequency.  Physical Exam  General:  alert.   Head:   normocephalic.   Eyes:  pupils round.   Lungs:  normal breath sounds.   Heart:  normal rate and regular rhythm.   Abdomen:  normal bowel sounds.   Msk:  pes planus Extremities:  1+ left pedal edema and 1+ right pedal edema.   Neurologic:  alert & oriented X3.     Impression & Recommendations:  Problem # 1:  LEG EDEMA, BILATERAL (ICD-782.3) advised pt to elevated legs TED hose given to pt  sodium restriction Her updated medication list for this problem includes:    Furosemide 20 Mg Tabs (Furosemide) ..... One tablet by mouth daily as needed for fluid  Orders: UA Dipstick w/o Micro (manual) (16109) Admin of Therapeutic Inj  intramuscular or subcutaneous (60454) Furosemide- Lasix Injection (U9811)  Complete Medication List: 1)  Furosemide 20 Mg Tabs (Furosemide) .... One tablet by mouth daily as needed for fluid  Patient Instructions: 1)  Ted hose - you should wear these during the day.  remove at night. 2)  You have received an injection of lasix today in the office. 3)  Get the prescription filled and take Saturday, Sunday and Monday then as needed 4)  Call this office back on Tuesday and speak to [facility] Nurse to give an update on fluid. 5)  Be sure to restrict your diet over the weekend.  plenty of water.  No added salt or preservatives in the food Prescriptions:  FUROSEMIDE 20 MG TABS (FUROSEMIDE) One tablet by mouth daily as needed for fluid  #30 x 0   Entered and Authorized by:   Lehman Prom FNP   Signed by:   Lehman Prom FNP on 05/14/2010   Method used:   Print then Give to Patient   RxID:   [id number]    Medication Administration  Injection # 1:    Medication: Furosemide- Lasix Injection    Diagnosis: LEG EDEMA, BILATERAL (ICD-782.3)    Route: IM    Site: RUOQ gluteus    Exp Date: 03/17/2011    Lot #: [id number]    Mfr: Hospira    Comments: 20mg   NDC# [id number]    Patient tolerated injection without complications    Given by: Hale Drone CMA  (May 14, 2010 2:18 PM)  Orders Added: 1)  Est. Patient Level III [99213] 2)  UA Dipstick w/o Micro (manual) [81002] 3)  Admin of Therapeutic Inj  intramuscular or subcutaneous [96372] 4)  Furosemide- Lasix Injection [J1940]    Prevention & Chronic Care Immunizations   Influenza vaccine: Not documented   Influenza vaccine deferral: Refused  (05/20/2009)    Tetanus booster: 01/12/2009: Tdap    Pneumococcal vaccine: Not documented  Other Screening   Pap smear:  Specimen Adequacy: Satisfactory for evaluation.   Interpretation/Result:Negative for intraepithelial Lesion or Malignancy.   Interpretation/Result:Reactive Changes.    (05/20/2009)   Pap smear action/deferral: Ordered  (05/20/2009)   Pap smear due: 05/2010   Smoking status: never  (05/20/2009)  Lipids   Total Cholesterol: 187  (05/20/2009)   Lipid panel action/deferral: Lipid Panel ordered   LDL: 129  (05/20/2009)   LDL Direct: Not documented   HDL: 44  (05/20/2009)   Triglycerides: 70  (05/20/2009)  Laboratory Results   Urine Tests  Date/Time Received: May 14, 2010 2:56 PM   Routine Urinalysis   Color: yellow Appearance: Hazy Glucose: negative   (Normal Range: Negative) Bilirubin: small   (Normal Range: Negative) Ketone: negative   (Normal Range: Negative) Spec. Gravity: >=1.030   (Normal Range: 1.003-1.035) Blood: negative   (Normal Range: Negative) pH: 6.5   (Normal Range: 5.0-8.0) Protein: 30   (Normal Range: Negative) Urobilinogen: 1.0   (Normal Range: 0-1) Nitrite: negative   (Normal Range: Negative) Leukocyte Esterace: negative   (Normal Range: Negative)

## 2010-06-17 NOTE — Progress Notes (Signed)
Summary: RETURNED YOUR CALL TODAY   Phone Note Call from Patient Call back at [phone]   Reason for Call: Lab or Test Results Summary of Call: NYKEDTRA PT. MS Jakes RETURNED YOUR PHONE CALL TODAY. I TOLD HER THAT YOU ARE OUT AND SHOULD BE BACK ON WEDNESDAY. SHE SAYS THAT YOU CAN CALL HER AT THE [address] CELL # Initial call taken by: Leodis Rains,  July 07, 2009 10:41 AM  Follow-up for Phone Call        Will call tomorrow when back in office. Follow-up by: Julieanne Manson MD,  July 08, 2009 10:24 AM  Additional Follow-up for Phone Call Additional follow up Details #1::        PT IS RETURNING DR MULBERY'S CALL...REGARDING EEG AND BACK XRAY. 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  Strongly urged she keep the appt. with [facility] when that comes through for perhaps another opinion on this as well as to get to the bottom of her Neuromuscular complaints.  Julieanne Manson MD  July 10, 2009 2:21 PM

## 2010-06-17 NOTE — Progress Notes (Signed)
   Phone Note Outgoing Call   Summary of Call: Please call EEG lab tomorrow on opening of clinic at [phone] and see if can get pt. set up for EEG tomorrow morning-Let them know this is at Dr. Oliva Bustard recommendation.  See how much this will cost pt. if possible.  Fax my note to the EEG lab.  Let them know to call the report to my cell phone at [phone].  Thanks Initial call taken by: Julieanne Manson MD,  July 02, 2009 7:02 PM  Follow-up for Phone Call        Called and spoke with EEG lab at [phone]--pt. to report to admitting at 10:30 this a.m. and will perform EEG at 11 a.m.   They could not give info on billing for EEG Pt. notified--discussed should ask Admitting about billing and cost. She will get Lumbar films done today while there as well. Information faxed to [phone]--with order for EEG Follow-up by: Julieanne Manson MD,  July 03, 2009 8:18 AM

## 2010-06-17 NOTE — Progress Notes (Signed)
   Phone Note Other Incoming Call back at [phone]--pt's cell   Summary of Call: From Dr. Sharene Skeans:  EEG was normal--still does not change [place] rule for no driving for 6 months. Attempted to call pt. and let her know, but only able to leave a message--to call beginning next week for more info.

## 2010-07-19 ENCOUNTER — Encounter: Payer: Self-pay | Admitting: Nurse Practitioner

## 2010-07-19 ENCOUNTER — Other Ambulatory Visit: Payer: Self-pay | Admitting: Nurse Practitioner

## 2010-07-19 ENCOUNTER — Encounter (INDEPENDENT_AMBULATORY_CARE_PROVIDER_SITE_OTHER): Payer: Self-pay | Admitting: Nurse Practitioner

## 2010-07-19 DIAGNOSIS — H6120 Impacted cerumen, unspecified ear: Secondary | ICD-10-CM | POA: Insufficient documentation

## 2010-07-19 DIAGNOSIS — T148XXA Other injury of unspecified body region, initial encounter: Secondary | ICD-10-CM | POA: Insufficient documentation

## 2010-07-19 LAB — CONVERTED CEMR LAB
Blood in Urine, dipstick: NEGATIVE
Nitrite: NEGATIVE
Specific Gravity, Urine: >=1.03
Urobilinogen, UA: 0.2
WBC Urine, dipstick: NEGATIVE

## 2010-07-21 ENCOUNTER — Encounter (INDEPENDENT_AMBULATORY_CARE_PROVIDER_SITE_OTHER): Payer: Self-pay | Admitting: Nurse Practitioner

## 2010-07-22 ENCOUNTER — Encounter (INDEPENDENT_AMBULATORY_CARE_PROVIDER_SITE_OTHER): Payer: Self-pay | Admitting: Nurse Practitioner

## 2010-07-22 LAB — CONVERTED CEMR LAB: ENA SM Ab Ser-aCnc: <1 (ref <30)

## 2010-07-27 NOTE — Assessment & Plan Note (Signed)
Summary: Complete Physical Exam   Vital Signs:  Patient profile:   35 year old female Menstrual status:  regular LMP:     06/19/2010 Weight:      224.8 pounds BMI:     37.54 Temp:     98.5 degrees F oral Pulse rate:   62 / minute Pulse rhythm:   regular Resp:     16 per minute BP sitting:   124 / 81  (left arm) Cuff size:   large  Vitals Entered By: Levon Hedger (July 19, 2010 9:57 AM)  Nutrition Counseling: Patient's BMI is greater than 25 and therefore counseled on weight management options. CC: CPP, Abdominal Pain Is Patient Diabetic? No Pain Assessment Patient in pain? yes     Location: feet Intensity: 7  Does patient need assistance? Functional Status Self care Ambulation Normal  Vision Screening:Left eye with correction: 20 / 25 Right eye with correction: 20 / 30 Both eyes with correction: 20 / 25        Vision Entered By: Levon Hedger (July 19, 2010 10:18 AM) LMP (date): 06/19/2010 LMP - Character: normal    Menses interval (days): 28 Menstrual flow (days): 5 Enter LMP: 06/19/2010 Last PAP Result  Specimen Adequacy: Satisfactory for evaluation.   Interpretation/Result:Negative for intraepithelial Lesion or Malignancy.   Interpretation/Result:Reactive Changes.     Primary Care Provider:  Rankins  CC:  CPP and Abdominal Pain.  History of Present Illness:  Pt into the office for a complete physical exam  PAP - last done 05/20/2009 Pt recently ended a long relationship- 1 child that is a Printmaker in high school No current birth control other than condoms  Mammogram - never had a mammogram.  no family history of breast cancer.  no self breast exams at home.  Optho - wears contacts. last eye exam was 1 year ago  Dental - no recent dental exam.    tdap - up to date 12/2008  Family history - she is the oldest of 3 girls. middle sister has lupus youngest sister has anemia  Dyspepsia History:      She has no alarm features of dyspepsia  including no history of melena, hematochezia, dysphagia, persistent vomiting, or involuntary weight loss > 5%.  There is a prior history of GERD.  The patient does not have a prior history of documented ulcer disease.  The dominant symptom is not heartburn or acid reflux.  An H-2 blocker medication is not currently being taken.     Habits & Providers  Alcohol-Tobacco-Diet     Alcohol drinks/day: 0     Tobacco Status: never  Exercise-Depression-Behavior     Does Patient Exercise: yes     Type of exercise: treadmill,situps     Times/week: 4     Depression Counseling: not indicated; screening negative for depression     Drug Use: no     Seat Belt Use: 100     Sun Exposure: occasionally  Allergies (verified): 1)  ! Epidural  Review of Systems General:  Denies fever. Eyes:  Denies discharge. ENT:  Denies earache. CV:  Complains of swelling of feet. Resp:  Complains of wheezing; denies cough; occasionally - pt does not have an inhaler, she completed her previous supply. GI:  Denies abdominal pain, nausea, and vomiting. GU:  Denies dysuria. MS:  Complains of stiffness; still in all joints. Derm:  Denies flushing. Neuro:  Denies headaches. Psych:  Denies anxiety and depression. Heme:  Complains of abnormal bruising.  Physical Exam  General:  alert.   Head:  normocephalic.   Eyes:  pupils round.   Ears:  bil Tm with bony landmarks present Nose:  no nasal discharge.   Mouth:  fair dentition.   Neck:  supple.   Chest Wall:  no mass.   Breasts:  no masses and no abnormal thickening.   Lungs:  normal breath sounds.   Heart:  normal rate and regular rhythm.   Abdomen:  normal bowel sounds.   Rectal:  defer Msk:  normal ROM.   flat feet Extremities:  trace left pedal edema and trace right pedal edema.   Neurologic:  gait normal.   Psych:  Oriented X3.    Pelvic Exam  Vulva:      normal appearance.   Urethra and Bladder:      Urethra--normal.   Vagina:      physiologic  discharge.   Cervix:      midposition, friable.   Uterus:      smooth.   Adnexa:      nontender bilaterally.      Impression & Recommendations:  Problem # 1:  ROUTINE GYNECOLOGICAL EXAMINATION (ICD-V72.31) PAP done today tdap up to date rec optho and dental exam self breast exam placcard given Orders: Vision Screening ([address]) KOH/ WET Mount [phone]) Pap Smear, Thin Prep ( Collection of) [phone]) T- GC Chlamydia (91478) UA Dipstick w/o Micro (manual) (81002) T-HIV Antibody  (Reflex) (29562-13086) T-Syphilis Test (RPR) (57846-96295)  Problem # 2:  OBESITY, UNSPECIFIED (ICD-278.00) advised exercise but due to foot pain pt has a difficult time being active Orders: T-Lipid Profile (28413-24401) T-Comprehensive Metabolic Panel (02725-36644) T-CBC w/Diff (03474-25956) T-HIV Antibody  (Reflex) (38756-43329) T-TSH (51884-16606)  Problem # 3:  CERUMEN IMPACTION (ICD-380.4) irrigation done today in office  Problem # 4:  ASTHMA (ICD-493.90)  Her updated medication list for this problem includes:    Ventolin Hfa 108 (90 Base) Mcg/act Aers (Albuterol sulfate) .Marland Kitchen..Marland Kitchen Two puffs every 6 hours as needed for shortness of breath  Complete Medication List: 1)  Furosemide 20 Mg Tabs (Furosemide) .... One tablet by mouth daily as needed for fluid 2)  Ventolin Hfa 108 (90 Base) Mcg/act Aers (Albuterol sulfate) .... Two puffs every 6 hours as needed for shortness of breath  Other Orders: T-Protime, Auto (30160-10932) T-Vitamin B12 (35573-22025) T-ANA (42706-23762)  Patient Instructions: 1)  Your labs will be checked today. 2)  You will be notified of the results. 3)  Dental - Call [facility] for an appointment at their dental hygiene clinic. 4)  [phone] [phone] 5)  Asthma - prescription for inhaler given so you can have with you at all times 6)  Follow up as needed Prescriptions: VENTOLIN HFA 108 (90 BASE) MCG/ACT AERS (ALBUTEROL SULFATE) Two puffs every 6 hours as needed for shortness  of breath  #1 x 3   Entered and Authorized by:   Lehman Prom FNP   Signed by:   Lehman Prom FNP on 07/19/2010   Method used:   Print then Give to Patient   RxID:   [id number]    Orders Added: 1)  Est. Patient age 65-39 [99395] 2)  Vision Screening [99173] 3)  KOH/ WET Mount [phone] 4)  Pap Smear, Thin Prep ( Collection of) [Q0091] 5)  T- GC Chlamydia [50093] 6)  UA Dipstick w/o Micro (manual) [81002] 7)  T-Lipid Profile [80061-22930] 8)  T-Comprehensive Metabolic Panel [80053-22900] 9)  T-CBC w/Diff [81829-93716] 10)  T-HIV Antibody  (Reflex) [96789-38101] 11)  T-Syphilis Test (RPR) [09811-91478] 12)  T-TSH [29562-13086] 13)  T-Protime, Auto [57846-96295] 14)  T-Vitamin B12 [82607-23330] 15)  T-ANA [28413-24401]    Prevention & Chronic Care Immunizations   Influenza vaccine: Not documented   Influenza vaccine deferral: Refused  (05/20/2009)    Tetanus booster: 01/12/2009: Tdap    Pneumococcal vaccine: Not documented  Other Screening   Pap smear:  Specimen Adequacy: Satisfactory for evaluation.   Interpretation/Result:Negative for intraepithelial Lesion or Malignancy.   Interpretation/Result:Reactive Changes.    (05/20/2009)   Pap smear action/deferral: Ordered  (05/20/2009)   Pap smear due: 05/2010   Smoking status: never  (07/19/2010)  Lipids   Total Cholesterol: 187  (05/20/2009)   Lipid panel action/deferral: Lipid Panel ordered   LDL: 129  (05/20/2009)   LDL Direct: Not documented   HDL: 44  (05/20/2009)   Triglycerides: 70  (05/20/2009)   Laboratory Results   Urine Tests  Date/Time Received: July 19, 2010 11:14 AM   Routine Urinalysis   Color: lt. yellow Appearance: Clear Glucose: negative   (Normal Range: Negative) Bilirubin: negative   (Normal Range: Negative) Ketone: negative   (Normal Range: Negative) Spec. Gravity: >=1.030   (Normal Range: 1.003-1.035) Blood: negative   (Normal Range: Negative) pH: 5.0   (Normal Range:  5.0-8.0) Protein: negative   (Normal Range: Negative) Urobilinogen: 0.2   (Normal Range: 0-1) Nitrite: negative   (Normal Range: Negative) Leukocyte Esterace: negative   (Normal Range: Negative)    Date/Time Received: July 19, 2010 1:49 PM   Wet Mount/KOH Source: vaginal WBC/hpf: 1-5 Bacteria/hpf: rare Clue cells/hpf: none Yeast/hpf: none Trichomonas/hpf: none   Laboratory Results   Urine Tests    Routine Urinalysis   Color: lt. yellow Appearance: Clear Glucose: negative   (Normal Range: Negative) Bilirubin: negative   (Normal Range: Negative) Ketone: negative   (Normal Range: Negative) Spec. Gravity: >=1.030   (Normal Range: 1.003-1.035) Blood: negative   (Normal Range: Negative) pH: 5.0   (Normal Range: 5.0-8.0) Protein: negative   (Normal Range: Negative) Urobilinogen: 0.2   (Normal Range: 0-1) Nitrite: negative   (Normal Range: Negative) Leukocyte Esterace: negative   (Normal Range: Negative)      Wet Mount Wet Mount KOH: Negative

## 2010-07-27 NOTE — Letter (Signed)
Summary: *HSN Results Follow up  [facility]  [address] [place], [place] [address]   Phone: [phone]  Fax: [phone]      07/21/2010   Linda Porter [address] [place], [place]  [address]   Dear  Linda Porter,                            ____S.Drinkard,FNP   ____D. Gore,FNP       ____B. McPherson,MD   ____V. Rankins,MD    ____E. Mulberry,MD    _X___N. Daphine Deutscher, FNP  ____D. Reche Dixon, MD    ____K. Philipp Deputy, MD    ____Other     This letter is to inform you that your recent test(s):  ___X____Pap Smear    _______Lab Test     _______X-ray    ____X___ is within acceptable limits  _______ requires a medication change  _______ requires a follow-up lab visit  _______ requires a follow-up visit with your provider   Comments:  Pap smear results are normal.       _________________________________________________________ If you have any questions, please contact our office [phone].                    Sincerely,    Lehman Prom FNP [facility]

## 2010-07-28 ENCOUNTER — Encounter (INDEPENDENT_AMBULATORY_CARE_PROVIDER_SITE_OTHER): Payer: Self-pay | Admitting: Internal Medicine

## 2010-07-28 DIAGNOSIS — N898 Other specified noninflammatory disorders of vagina: Secondary | ICD-10-CM | POA: Insufficient documentation

## 2010-07-28 LAB — CONVERTED CEMR LAB: KOH Prep: NEGATIVE

## 2010-08-03 NOTE — Assessment & Plan Note (Signed)
Summary: nurse visit Vag D/C  Nurse Visit   Vitals Entered By: Gaylyn Cheers RN (July 28, 2010 11:06 AM)  Patient Instructions: 1)  Negative wet prep.  2)  If you continue to have problems schedule appointment with Dr. Delrae Alfred.   Complete Medication List: 1)  Furosemide 20 Mg Tabs (Furosemide) .... One tablet by mouth daily as needed for fluid 2)  Ventolin Hfa 108 (90 Base) Mcg/act Aers (Albuterol sulfate) .... Two puffs every 6 hours as needed for shortness of breath  Other Orders: Skip Mayer ([phone])   Primary Care Provider:  Lehman Prom FNP  CC:  Vaginal D/C.  History of Present Illness: Vaginal D/C brownish in color, had period last week however had brown D/C proir to that. Denies any itching, burning or dysuria. Complains of odor. Discussed with De. Wilson.  CC: Vaginal D/C   Allergies: 1)  ! Epidural Laboratory Results  Date/Time Received: July 28, 2010 11:20 AM   [employer] Source: vaginal WBC/hpf: occ Bacteria/hpf: 1+ Clue cells/hpf: none Yeast/hpf: none Wet Mount KOH: Negative Trichomonas/hpf: none   Orders Added: 1)  Est. Patient Level I [02725] 2)  KOH/ [facility] [87210]  Laboratory Results    Wet Mount/KOH

## 2010-08-04 LAB — CBC
Platelets: 234 10*3/uL (ref 150–400)
RDW: 14.2 % (ref 11.5–15.5)

## 2010-08-04 LAB — BASIC METABOLIC PANEL
BUN: 5 mg/dL — ABNORMAL LOW (ref 6–23)
Calcium: 9.1 mg/dL (ref 8.4–10.5)
Creatinine, Ser: 0.77 mg/dL (ref 0.4–1.2)
GFR calc non Af Amer: >60 mL/min (ref >60)
Glucose, Bld: 99 mg/dL (ref 70–99)
Sodium: 139 mEq/L (ref 135–145)

## 2010-08-04 LAB — RAPID URINE DRUG SCREEN, HOSP PERFORMED
Amphetamines: NOT DETECTED
Barbiturates: NOT DETECTED
Benzodiazepines: NOT DETECTED
Cocaine: NOT DETECTED
Opiates: NOT DETECTED

## 2010-08-04 LAB — URINALYSIS, ROUTINE W REFLEX MICROSCOPIC
Glucose, UA: NEGATIVE mg/dL
Hgb urine dipstick: NEGATIVE
Protein, ur: NEGATIVE mg/dL

## 2010-08-04 LAB — POCT PREGNANCY, URINE: Preg Test, Ur: NEGATIVE

## 2010-08-04 LAB — DIFFERENTIAL
Basophils Absolute: 0 10*3/uL (ref 0.0–0.1)
Basophils Relative: 1 % (ref 0–1)
Lymphocytes Relative: 34 % (ref 12–46)
Neutro Abs: 2.7 10*3/uL (ref 1.7–7.7)

## 2011-07-21 ENCOUNTER — Other Ambulatory Visit: Payer: Self-pay | Admitting: Family Medicine

## 2011-08-31 ENCOUNTER — Ambulatory Visit (INDEPENDENT_AMBULATORY_CARE_PROVIDER_SITE_OTHER): Payer: No Typology Code available for payment source | Admitting: Family Medicine

## 2011-08-31 ENCOUNTER — Encounter: Payer: Self-pay | Admitting: Family Medicine

## 2011-08-31 VITALS — BP 118/76 | HR 68 | Ht 64.0 in | Wt 229.0 lb

## 2011-08-31 DIAGNOSIS — E559 Vitamin D deficiency, unspecified: Secondary | ICD-10-CM | POA: Insufficient documentation

## 2011-08-31 DIAGNOSIS — J45909 Unspecified asthma, uncomplicated: Secondary | ICD-10-CM

## 2011-08-31 DIAGNOSIS — R202 Paresthesia of skin: Secondary | ICD-10-CM

## 2011-08-31 DIAGNOSIS — M2142 Flat foot [pes planus] (acquired), left foot: Secondary | ICD-10-CM | POA: Insufficient documentation

## 2011-08-31 DIAGNOSIS — R209 Unspecified disturbances of skin sensation: Secondary | ICD-10-CM

## 2011-08-31 DIAGNOSIS — M2140 Flat foot [pes planus] (acquired), unspecified foot: Secondary | ICD-10-CM

## 2011-08-31 DIAGNOSIS — R252 Cramp and spasm: Secondary | ICD-10-CM

## 2011-08-31 DIAGNOSIS — R238 Other skin changes: Secondary | ICD-10-CM

## 2011-08-31 DIAGNOSIS — K219 Gastro-esophageal reflux disease without esophagitis: Secondary | ICD-10-CM | POA: Insufficient documentation

## 2011-08-31 DIAGNOSIS — M2141 Flat foot [pes planus] (acquired), right foot: Secondary | ICD-10-CM | POA: Insufficient documentation

## 2011-08-31 LAB — CBC WITH DIFFERENTIAL/PLATELET
Basophils Absolute: 0 10*3/uL (ref 0.0–0.1)
Basophils Relative: 1 % (ref 0–1)
Eosinophils Absolute: 0.1 10*3/uL (ref 0.0–0.7)
HCT: 38.6 % (ref 36.0–46.0)
MCH: 26.6 pg (ref 26.0–34.0)
MCHC: 31.6 g/dL (ref 30.0–36.0)
Monocytes Absolute: 0.3 10*3/uL (ref 0.1–1.0)
Monocytes Relative: 7 % (ref 3–12)
Neutro Abs: 2.9 10*3/uL (ref 1.7–7.7)
RDW: 14.4 % (ref 11.5–15.5)

## 2011-08-31 LAB — COMPREHENSIVE METABOLIC PANEL
ALT: 22 U/L (ref 0–35)
AST: 22 U/L (ref 0–37)
Albumin: 4.3 g/dL (ref 3.5–5.2)
Alkaline Phosphatase: 60 U/L (ref 39–117)
Calcium: 9.1 mg/dL (ref 8.4–10.5)
Chloride: 104 mEq/L (ref 96–112)
Creat: 0.85 mg/dL (ref 0.50–1.10)
Potassium: 4.4 mEq/L (ref 3.5–5.3)

## 2011-08-31 LAB — MAGNESIUM: Magnesium: 1.9 mg/dL (ref 1.5–2.5)

## 2011-08-31 LAB — VITAMIN B12: Vitamin B-12: 379 pg/mL (ref 211–911)

## 2011-08-31 NOTE — Progress Notes (Signed)
Chief complaint: leg cramps b/l x 20 years, has been treated in the past, treatments never worked. She has flat feet and tendencies to fall, has been falling more often over the past months  HPI:  Patient presents with complaints of increased bruising--has always had bruising, but they seem to be lasting longer now, up to a month long.  Denies taking any anti-inflammatory medications or aspirin.    Also complaining of her feet and legs hurting more.  She has a history of neuropathy and leg cramps, and pain due to flat feet, but worse over the last 2 years.  Some days she feels like she is walking on "pins" all day. Sometimes her foot or knee will just "give out" and she will fall.  Used to fall just once every 3 months, but now every 3 weeks.  Denies any balance problems, thinks it is more related to her flat feet.  She previously wore orthotics--she wears through them in just a few months, and too expensive to continue to replace.  She started with orthotics at the age of 28, and they were hard and plastic, but all recent orthotics have been soft, not the hard plastic ones.  She had less problems falling when she had the hard orthotics.  Has pain if walks a lot.  Paresthesias in L>R feet; also has cramps in the feet.  Vitamin D deficiency was diagnosed last month.  Took weekly Rx vitamin D for the last month, last taken Friday, hasn't gotten refilled yet.   Past Medical History  Diagnosis Date  . Asthma   . GERD (gastroesophageal reflux disease)   . Neuropathy   . Lumbago   . Eczema   . Leg cramps   . Flat feet   . Seizure 06/29/2009    saw neuro (felt to be related to a med she was taking)    Past Surgical History  Procedure Date  . Cesarean section     History   Social History  . Marital Status: Single    Spouse Name: N/A    Number of Children: 1  . Years of Education: N/A   Occupational History  . nanny; cleans at [employer]   Social History Main Topics    . Smoking status: Never Smoker   . Smokeless tobacco: Never Used  . Alcohol Use: Yes     maybe 2 drinks per week.  . Drug Use: No  . Sexually Active: Not Currently   Other Topics Concern  . Not on file   Social History Narrative   Lives with daughter, dog    Family History  Problem Relation Age of Onset  . Asthma Mother   . Cancer Mother     multiple myeloma  . Kidney disease Mother     related to chemo for treating multiple myeloma  . Hypertension Father   . Lupus Sister     affecting skin only  . Asthma Sister   . Diabetes Neg Hx   . Heart disease Neg Hx   . Arthritis Sister     related to MVA    Current outpatient prescriptions:albuterol (PROVENTIL HFA;VENTOLIN HFA) 108 (90 BASE) MCG/ACT inhaler, Inhale 2 puffs into the lungs every 6 (six) hours as needed., Disp: , Rfl: ;  ergocalciferol (VITAMIN D2) 50000 UNITS capsule, Take 50,000 Units by mouth once a week., Disp: , Rfl:   Allergies  Allergen Reactions  . Epidural Tray Swelling    ROS: + Easy bruisability. Reflux  with certain foods, or if eating past 7pm, low back pain (better than a few years ago) Denies fevers, URI symptoms.  Needed to use inhaler over the weekend due pollen/allergies, but also flare of reflux over the weekend.  Denies chest pain, palpitations, nausea, vomiting, diarrhea, blood in stools, bladder problems, vaginal discharge.  PHYSICAL EXAM: BP 118/76  Pulse 68  Ht 5\' 4"  (1.626 m)  Wt 229 lb (103.874 kg)  BMI 39.31 kg/m2  LMP 08/29/2011 Well developed, pleasant obese female in no distress Neck: no lymphadenopathy or thyromegaly or mass HEENT:  PERRL, conjunctiva clear, OP clear Heart: regular rate and rhythm without murmur Lungs: clear bilaterally Abdomen: soft, nontender, no organomegaly or mass Extremities: no edema.  Flat feet.  2+ pulses. Normal sensation.  nontender. Skin: A few scattered small bruises on upper extremities (2 on R, 1 on L), and multiple bruises on lower legs  bilaterally   ASSESSMENT/PLAN: 1. Leg cramps  Comprehensive metabolic panel, Magnesium  2. Paresthesias  TSH, Vitamin B12, Hemoglobin A1c  3. Easy bruising  CBC with Differential  4. Pes planus (flat feet)    5. GERD (gastroesophageal reflux disease)    6. Asthma      Foot pain and falls, possibly both related to flat feet.  Recommend evaluation by podiatrist, possibly re-try hard orthotic.  Must also evaluate for causes of neuropathy  Easy bruising--avoid aspirin and nsaids, check cbc today  Paresthesias and foot cramps.  Check c-met, a1c, b12, Mg  Drink plenty of fluids  GERD--discussed prn OTC meds Asthma--possibly triggered by allergies, and also likely triggered by reflux.  Vitamin D deficiency--being treated by GYN. Reminded to get refills, and to change over to OTC Vitamin D when rx is completed to maintain levels   (handicap sticker going to expire in May--plans to try to get from podiatrist)

## 2011-08-31 NOTE — Patient Instructions (Signed)
Please see podiatrist regarding your feet (ie for possibly getting harder orthotic).  Consider [facility]  Reflux can trigger asthma.  So, try and avoid foods that trigger reflux.  If you know you are going to eat something that will cause reflux, then try taking Pepcid AC or Zantac or Prilosec prior to that meal.  Diet for GERD or PUD Nutrition therapy can help ease the discomfort of gastroesophageal reflux disease (GERD) and peptic ulcer disease (PUD).  HOME CARE INSTRUCTIONS   Eat your meals slowly, in a relaxed setting.   Eat 5 to 6 small meals per day.   If a food causes distress, stop eating it for a period of time.  FOODS TO AVOID  Coffee, regular or decaffeinated.   Cola beverages, regular or low calorie.   Tea, regular or decaffeinated.   Pepper.   Cocoa.   High fat foods, including meats.   Butter, margarine, hydrogenated oil (trans fats).   Peppermint or spearmint (if you have GERD).   Fruits and vegetables if not tolerated.   Alcohol.   Nicotine (smoking or chewing). This is one of the most potent stimulants to acid production in the gastrointestinal tract.   Any food that seems to aggravate your condition.  If you have questions regarding your diet, ask your caregiver or a registered dietitian. TIPS  Lying flat may make symptoms worse. Keep the head of your bed raised 6 to 9 inches (15 to 23 cm) by using a foam wedge or blocks under the legs of the bed.   Do not lay down until 3 hours after eating a meal.   Daily physical activity may help reduce symptoms.  MAKE SURE YOU:   Understand these instructions.   Will watch your condition.   Will get help right away if you are not doing well or get worse.  Document Released: 05/02/2005 Document Revised: 04/21/2011 Document Reviewed: 03/18/2011 [facility] Patient Information 2012 [place], [place].

## 2011-09-01 ENCOUNTER — Encounter: Payer: Self-pay | Admitting: Family Medicine

## 2011-10-03 ENCOUNTER — Telehealth: Payer: Self-pay | Admitting: Family Medicine

## 2011-10-03 MED ORDER — ALBUTEROL SULFATE HFA 108 (90 BASE) MCG/ACT IN AERS: 2.0000 | INHALATION_SPRAY | Freq: Four times a day (QID) | RESPIRATORY_TRACT | Status: DC | PRN

## 2011-10-03 NOTE — Telephone Encounter (Signed)
Albuterol rx done.  Ok for handicap placard--I need form to fill out

## 2011-10-03 NOTE — Telephone Encounter (Signed)
Paper to be filled out on chart and will place in your office

## 2011-10-05 NOTE — Telephone Encounter (Signed)
Form filled out--please stamp it and copy for chart

## 2011-10-05 NOTE — Telephone Encounter (Signed)
Formed filled out and copied for chart and pt notified its up front for her.

## 2013-10-14 ENCOUNTER — Other Ambulatory Visit: Payer: Self-pay | Admitting: Family Medicine

## 2013-10-14 ENCOUNTER — Other Ambulatory Visit (HOSPITAL_COMMUNITY)
Admission: RE | Admit: 2013-10-14 | Discharge: 2013-10-14 | Disposition: A | Discharge status: Home / Self Care | Payer: BC Managed Care – PPO | Source: Ambulatory Visit | Attending: Family Medicine | Admitting: Family Medicine

## 2013-10-14 DIAGNOSIS — R102 Pelvic and perineal pain: Secondary | ICD-10-CM

## 2013-10-14 DIAGNOSIS — Z0000 Encounter for general adult medical examination without abnormal findings: Secondary | ICD-10-CM | POA: Insufficient documentation

## 2013-10-16 ENCOUNTER — Ambulatory Visit
Admission: RE | Admit: 2013-10-16 | Discharge: 2013-10-16 | Disposition: A | Discharge status: Home / Self Care | Payer: BC Managed Care – PPO | Source: Ambulatory Visit | Attending: Family Medicine | Admitting: Family Medicine

## 2013-10-16 DIAGNOSIS — R102 Pelvic and perineal pain: Secondary | ICD-10-CM

## 2013-10-17 ENCOUNTER — Other Ambulatory Visit: Payer: Self-pay | Admitting: Family Medicine

## 2013-10-17 DIAGNOSIS — R102 Pelvic and perineal pain: Secondary | ICD-10-CM

## 2013-12-04 ENCOUNTER — Other Ambulatory Visit: Payer: Self-pay | Admitting: Family Medicine

## 2013-12-04 DIAGNOSIS — N6320 Unspecified lump in the left breast, unspecified quadrant: Principal | ICD-10-CM

## 2013-12-04 DIAGNOSIS — N6325 Unspecified lump in the left breast, overlapping quadrants: Secondary | ICD-10-CM

## 2013-12-04 DIAGNOSIS — N644 Mastodynia: Secondary | ICD-10-CM

## 2013-12-11 ENCOUNTER — Encounter (INDEPENDENT_AMBULATORY_CARE_PROVIDER_SITE_OTHER): Payer: Self-pay

## 2013-12-11 ENCOUNTER — Ambulatory Visit
Admission: RE | Admit: 2013-12-11 | Discharge: 2013-12-11 | Disposition: A | Discharge status: Home / Self Care | Payer: BC Managed Care – PPO | Source: Ambulatory Visit | Attending: Family Medicine | Admitting: Family Medicine

## 2013-12-11 DIAGNOSIS — N6325 Unspecified lump in the left breast, overlapping quadrants: Secondary | ICD-10-CM

## 2013-12-11 DIAGNOSIS — N6320 Unspecified lump in the left breast, unspecified quadrant: Principal | ICD-10-CM

## 2013-12-11 DIAGNOSIS — N644 Mastodynia: Secondary | ICD-10-CM

## 2015-01-30 ENCOUNTER — Ambulatory Visit: Payer: Self-pay | Admitting: Neurology

## 2015-03-04 ENCOUNTER — Encounter: Payer: Self-pay | Admitting: Neurology

## 2015-03-04 ENCOUNTER — Ambulatory Visit (INDEPENDENT_AMBULATORY_CARE_PROVIDER_SITE_OTHER): Payer: BLUE CROSS/BLUE SHIELD | Admitting: Neurology

## 2015-03-04 VITALS — BP 114/82 | HR 86 | Resp 16 | Ht 62.0 in | Wt 249.0 lb

## 2015-03-04 DIAGNOSIS — R569 Unspecified convulsions: Secondary | ICD-10-CM | POA: Diagnosis not present

## 2015-03-04 NOTE — Progress Notes (Signed)
NEUROLOGY CONSULTATION NOTE  Linda Porter MRN: [id number] DOB: [birth year]  Referring provider: Dr. Antony Blackbird Primary care provider: Dr. Rita Ohara  Reason for consult:  Possible seizures  Dear Dr Chapman Fitch:  Thank you for your kind referral of Linda Porter for consultation of the above symptoms. Although her history is well known to you, please allow me to reiterate it for the purpose of our medical record. Records and images were personally reviewed where available.  HISTORY OF PRESENT ILLNESS: This is a 39 year old right-handed woman with no significant past medical history presenting for evaluation of recurrent nocturnal episodes that occur if she has not slept for a week and a half or longer. She would wake up feeling hot and sweaty, with her throat dry, not knowing what is going on. Her muscles are sore, but she just assumes she is tired, although feels even more fatigued than usual. She has woken up on the floor this way, with urinary incontinence and tongue bite in the past, as well as with bruises on her arms. She reports having once seizure in wakefulness 5 years ago, she was in the bathroom and felt her hands go numb, then family heard her fall and found her shaking. She reports she could feel everything, "like I was watching myself," and recalls people talking to her, she could hear them but their lips were not moving. She saw a neurologist but tells me no tests were done, then symptoms stopped for 3 years, until nocturnal events started recently, occurring around twice a month. She has asked her ex-husband to sleep in her house when she is very tired, and he has told her that her whole body gets hot and that she would shake with her eyes open, going back and forth or rolling back. She occasionally feels her lip twitching before going to sleep.   She recalls an episode of loss of consciousness 19 years ago, again she could hear people talking but their lips were not moving. She  has had gaps in time in the past, mostly when sleep deprived, last was 3 months ago. Her feet and hands are frequently numb, and she attributes this to flat feet. She has always had insomnia and has tried several over the counter sleep aides with no effect. She denies any staring/unresponsive episodes,olfactory/gustatory hallucinations, deja vu, rising epigastric sensation, focal numbness/tingling/weakness, myoclonic jerks. She reports tremulousness in both hands in the past. He has headaches in the base of her head mostly when tired. She denies any dizziness, diplopia, dysarthria/dysphagia, bowel/bladder dysfunction. She has occasional neck and back pain.  Epilepsy Risk Factors:  She had a normal birth and early development.  There is no history of febrile convulsions, CNS infections such as meningitis/encephalitis, significant traumatic brain injury, neurosurgical procedures, or family history of seizures.  PAST MEDICAL HISTORY: Past Medical History  Diagnosis Date  . Asthma   . GERD (gastroesophageal reflux disease)   . Neuropathy ([place])   . Lumbago   . Eczema   . Leg cramps   . Flat feet   . Seizure ([place]) 06/29/2009    saw neuro (felt to be related to a med she was taking)    PAST SURGICAL HISTORY: Past Surgical History  Procedure Laterality Date  . Cesarean section      x 1    MEDICATIONS: No current outpatient prescriptions on file prior to visit.   No current facility-administered medications on file prior to visit.    ALLERGIES: Allergies  Allergen Reactions  . Nerve Block Tray Swelling    FAMILY HISTORY: Family History  Problem Relation Age of Onset  . Asthma Mother   . Cancer Mother     multiple myeloma  . Kidney disease Mother     related to chemo for treating multiple myeloma  . Hypertension Father   . Lupus Sister     affecting skin only  . Asthma Sister   . Diabetes Neg Hx   . Heart disease Neg Hx   . Arthritis Sister     related to MVA    SOCIAL  HISTORY: Social History   Social History  . Marital Status: Single    Spouse Name: N/A  . Number of Children: 1  . Years of Education: N/A   Occupational History  . 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 Smoking status: Never Smoker   . Smokeless tobacco: Never Used  . Alcohol Use: 0.0 oz/week    0 Standard drinks or equivalent per week     Comment: Occ  . Drug Use: No  . Sexual Activity: Not Currently   Other Topics Concern  . Not on file   Social History Narrative   Lives with daughter, dog    REVIEW OF SYSTEMS: Constitutional: No fevers, chills, or sweats, no generalized fatigue, change in appetite Eyes: No visual changes, double vision, eye pain Ear, nose and throat: No hearing loss, ear pain, nasal congestion, sore throat Cardiovascular: No chest pain, palpitations Respiratory:  No shortness of breath at rest or with exertion, wheezes GastrointestinaI: No nausea, vomiting, diarrhea, abdominal pain, fecal incontinence Genitourinary:  No dysuria, urinary retention or frequency Musculoskeletal:  + neck pain, back pain Integumentary: No rash, pruritus, skin lesions Neurological: as above Psychiatric: No depression,+ insomnia, no anxiety Endocrine: No palpitations, fatigue, diaphoresis, mood swings, change in appetite, change in weight, increased thirst Hematologic/Lymphatic:  No anemia, purpura, petechiae. Allergic/Immunologic: no itchy/runny eyes, nasal congestion, recent allergic reactions, rashes  PHYSICAL EXAM: Filed Vitals:   03/04/15 1408  BP: 114/82  Pulse: 86  Resp: 16   General: No acute distress Head:  Normocephalic/atraumatic Eyes: Fundoscopic exam shows bilateral sharp discs, no vessel changes, exudates, or hemorrhages Neck: supple, no paraspinal tenderness, full range of motion Back: No paraspinal tenderness Heart: regular rate and rhythm Lungs: Clear to auscultation bilaterally. Vascular: No carotid  bruits. Skin/Extremities: No rash, no edema Neurological Exam: Mental status: alert and oriented to person, place, and time, no dysarthria or aphasia, Fund of knowledge is appropriate.  Recent and remote memory are intact.  Attention and concentration are normal.    Able to name objects and repeat phrases. Cranial nerves: CN I: not tested CN II: pupils equal, round and reactive to light, visual fields intact, fundi unremarkable. CN III, IV, VI:  full range of motion, no nystagmus, no ptosis CN V: facial sensation intact CN VII: upper and lower face symmetric CN VIII: hearing intact to finger rub CN IX, X: gag intact, uvula midline CN XI: sternocleidomastoid and trapezius muscles intact CN XII: tongue midline Bulk & Tone: normal, no fasciculations. Motor: 5/5 throughout with no pronator drift. Sensation: decreased cold, pin, vibration in both LE up to ankles bilaterally, intact to all modalities on both UE.  No extinction to double simultaneous stimulation.  Romberg test negative Deep Tendon Reflexes: +1 throughout, no ankle clonus Plantar responses: downgoing bilaterally Cerebellar: no incoordination on finger to nose, heel to shin. No dysdiadochokinesia Gait: slow  and cautious due to bilateral foot pain/flat feet (chronic per patient) Tremor: none  IMPRESSION: This is a 39 year old right-handed woman with a history of a convulsion 5 years ago, symptom-free for several years until recently when she started having recurrent nocturnal episodes where she has been described as hot, shaking, waking up on the floor with urinary incontinence and tongue bite. Episodes are certainly concerning for nocturnal seizures, other considerations include possible sleep disorder. MRI brain with and without contrast and 1-hour sleep-deprived EEG will be ordered to assess for focal abnormalities that increase risk for recurrent seizures. If EEG normal, a 48-hour EEG will be done to further classify her symptoms.  [place] driving laws were discussed with the patient, and she knows to stop driving after an episode of loss of consciousness, until 6 months seizure-free. She will follow-up after the tests.   Thank you for allowing me to participate in the care of this patient. Please do not hesitate to call for any questions or concerns.   Ellouise Newer, M.D.  CC: Dr. Chapman Fitch, Dr. Tomi Bamberger

## 2015-03-04 NOTE — Patient Instructions (Signed)
1. Schedule MRI brain with and without contrast 2. Schedule 1-hour sleep-deprived EEG 3. If tests are normal, we will plan for a 48-hour EEG 4. Follow-up in 3-4 weeks 5. As per [place] driving laws, if you lose consciousness, one should not drive until 6 months event-free

## 2015-03-09 DIAGNOSIS — R569 Unspecified convulsions: Secondary | ICD-10-CM | POA: Insufficient documentation

## 2015-03-13 ENCOUNTER — Ambulatory Visit (HOSPITAL_COMMUNITY)
Admission: RE | Admit: 2015-03-13 | Discharge: 2015-03-13 | Disposition: A | Discharge status: Home / Self Care | Payer: BLUE CROSS/BLUE SHIELD | Source: Ambulatory Visit | Attending: Neurology | Admitting: Neurology

## 2015-03-13 ENCOUNTER — Telehealth: Payer: Self-pay | Admitting: Family Medicine

## 2015-03-13 DIAGNOSIS — R4182 Altered mental status, unspecified: Secondary | ICD-10-CM | POA: Diagnosis not present

## 2015-03-13 DIAGNOSIS — R32 Unspecified urinary incontinence: Secondary | ICD-10-CM | POA: Diagnosis not present

## 2015-03-13 DIAGNOSIS — R569 Unspecified convulsions: Secondary | ICD-10-CM | POA: Diagnosis present

## 2015-03-13 MED ORDER — GADOBENATE DIMEGLUMINE 529 MG/ML IV SOLN
20.0000 mL | Freq: Once | INTRAVENOUS | Status: AC | PRN
  Administered 2015-03-13: 20 mL via INTRAVENOUS

## 2015-03-13 NOTE — Telephone Encounter (Signed)
Patient was notified of result and advisement.

## 2015-03-13 NOTE — Telephone Encounter (Signed)
-----   Message from Cameron Sprang, MD sent at 03/13/2015  2:41 PM EDT ----- Pls let her know I reviewed MRI brain and it is normal, no evidence of tumor, stroke, or bleed. Proceed with EEG as discussed. Thanks

## 2015-03-18 ENCOUNTER — Ambulatory Visit (INDEPENDENT_AMBULATORY_CARE_PROVIDER_SITE_OTHER): Payer: BLUE CROSS/BLUE SHIELD | Admitting: Neurology

## 2015-03-18 DIAGNOSIS — R569 Unspecified convulsions: Secondary | ICD-10-CM | POA: Diagnosis not present

## 2015-03-19 ENCOUNTER — Telehealth: Payer: Self-pay | Admitting: Family Medicine

## 2015-03-19 DIAGNOSIS — R569 Unspecified convulsions: Secondary | ICD-10-CM

## 2015-03-19 NOTE — Telephone Encounter (Signed)
-----   Message from Cameron Sprang, MD sent at 03/19/2015 11:00 AM EDT ----- Pls let her know EEG is normal, would proceed with 48-hour EEG as discussed. Thanks

## 2015-03-19 NOTE — Procedures (Signed)
ELECTROENCEPHALOGRAM REPORT  Date of Study: 03/19/2015  Patient's Name: Linda Porter MRN: [id number] Date of Birth: 1976/05/10  Referring Provider: Dr. Ellouise Newer  Clinical History: This is a 39 year old woman with a history of seizure 06/29/2009 who recently has been having nocturnal events of being hot, shaking and waking on the floor with urinary incontinence and tongue bite.   Medications: none  Technical Summary: A multichannel digital 1-hour sleep-deprived EEG recording measured by the international 10-20 system with electrodes applied with paste and impedances below 5000 ohms performed in our laboratory with EKG monitoring in an awake and asleep patient.  Hyperventilation and photic stimulation were performed.  The digital EEG was referentially recorded, reformatted, and digitally filtered in a variety of bipolar and referential montages for optimal display.    Description: The patient is awake and asleep during the recording.  During maximal wakefulness, there is a symmetric, medium voltage 9.5 Hz posterior dominant rhythm that attenuates with eye opening.  The record is symmetric.  During drowsiness and sleep, there is an increase in theta slowing of the background.  Vertex waves and symmetric sleep spindles were seen.  Hyperventilation and photic stimulation did not elicit any abnormalities.  There were no epileptiform discharges or electrographic seizures seen.    EKG lead was unremarkable.  Impression: This 1-hour sleep-deprived awake and asleep EEG is normal.    Clinical Correlation: A normal EEG does not exclude a clinical diagnosis of epilepsy.  If further clinical questions remain, prolonged EEG may be helpful.  Clinical correlation is advised.   Ellouise Newer, M.D.

## 2015-03-19 NOTE — Telephone Encounter (Signed)
Patient was notified of result. 48 hour eeg will be scheduled.

## 2015-03-23 ENCOUNTER — Ambulatory Visit (INDEPENDENT_AMBULATORY_CARE_PROVIDER_SITE_OTHER): Payer: BLUE CROSS/BLUE SHIELD | Admitting: Neurology

## 2015-03-23 DIAGNOSIS — R569 Unspecified convulsions: Secondary | ICD-10-CM

## 2015-04-08 ENCOUNTER — Ambulatory Visit (INDEPENDENT_AMBULATORY_CARE_PROVIDER_SITE_OTHER): Payer: BLUE CROSS/BLUE SHIELD | Admitting: Neurology

## 2015-04-08 ENCOUNTER — Encounter: Payer: Self-pay | Admitting: Neurology

## 2015-04-08 DIAGNOSIS — R6889 Other general symptoms and signs: Secondary | ICD-10-CM | POA: Diagnosis not present

## 2015-04-08 DIAGNOSIS — G4700 Insomnia, unspecified: Secondary | ICD-10-CM

## 2015-04-08 DIAGNOSIS — IMO0001 Reserved for inherently not codable concepts without codable children: Secondary | ICD-10-CM

## 2015-04-08 MED ORDER — ZOLPIDEM TARTRATE 5 MG PO TABS: 5.0000 mg | ORAL_TABLET | Freq: Every evening | ORAL | Status: DC | PRN

## 2015-04-08 NOTE — Patient Instructions (Addendum)
1. Refer to Sleep Medicine for insomnia 2. Start Ambien 5mg  at bedtime. Monitor for daytime drowsiness. Practice good sleep hygiene. 3. Keep a calendar of your symptoms, follow-up in 3 months 4. If you lose consciousness or awareness, stop driving until 6 months event-free

## 2015-04-08 NOTE — Progress Notes (Signed)
NEUROLOGY FOLLOW UP OFFICE NOTE  Linda Porter [id number]  HISTORY OF PRESENT ILLNESS: I had the pleasure of seeing Linda Porter in follow-up in the neurology clinic on 04/08/2015.  The patient was last seen a month ago for concern for nocturnal seizures where she has woken up on the floor with urinary incontinence and tongue bite. Records and images were personally reviewed where available.  I personally reviewed MRI brain with and without contrast which was normal. There was note of a subtle area of indistinct enhancement along the superior right insula that appears benign and vascular related such as due to capillary telangiectasia. Her 1-hour EEG and 48-hour EEG were normal, typical events were not captured. She denies any episodes since her last visit. She denies any staring/unresponsive episodes, gaps in time, focal numbness/tingling/weakness. She is having headaches and earaches on the left side due to painful left wisdom tooth that is scheduled for extraction next week. She fell one time when her feet gave out on her, no loss of consciousness.   HPI: This is a 39 yo RH woman with no significant past medical history with recurrent nocturnal episodes that occur if she has not slept for a week and a half or longer. She would wake up feeling hot and sweaty, with her throat dry, not knowing what is going on. Her muscles are sore, but she just assumes she is tired, although feels even more fatigued than usual. She has woken up on the floor this way, with urinary incontinence and tongue bite in the past, as well as with bruises on her arms. She reports having once seizure in wakefulness 5 years ago, she was in the bathroom and felt her hands go numb, then family heard her fall and found her shaking. She reports she could feel everything, "like I was watching myself," and recalls people talking to her, she could hear them but their lips were not moving. She saw a neurologist but tells me no tests  were done, then symptoms stopped for 3 years, until nocturnal events started recently, occurring around twice a month. She has asked her ex-husband to sleep in her house when she is very tired, and he has told her that her whole body gets hot and that she would shake with her eyes open, going back and forth or rolling back. She occasionally feels her lip twitching before going to sleep.   She recalls an episode of loss of consciousness 19 years ago, again she could hear people talking but their lips were not moving. She has had gaps in time in the past, mostly when sleep deprived, last was 3 months ago. Her feet and hands are frequently numb, and she attributes this to flat feet. She has always had insomnia and has tried several over the counter sleep aides with no effect. She denies any staring/unresponsive episodes,olfactory/gustatory hallucinations, deja vu, rising epigastric sensation, focal numbness/tingling/weakness, myoclonic jerks. She reports tremulousness in both hands in the past. She has headaches in the base of her head mostly when tired. She had a normal birth and early development. There is no history of febrile convulsions, CNS infections such as meningitis/encephalitis, significant traumatic brain injury, neurosurgical procedures, or family history of seizures  PAST MEDICAL HISTORY: Past Medical History  Diagnosis Date  . Asthma   . GERD (gastroesophageal reflux disease)   . Neuropathy ([facility])   . Lumbago   . Eczema   . Leg cramps   . Flat feet   . Seizure ([facility])  06/29/2009    saw neuro (felt to be related to a med she was taking)    MEDICATIONS: Current Outpatient Prescriptions on File Prior to Visit  Medication Sig Dispense Refill  . diclofenac (FLECTOR) 1.3 % PTCH Place 1 patch onto the skin. Twice a week     No current facility-administered medications on file prior to visit.    ALLERGIES: Allergies  Allergen Reactions  . Nerve Block Tray Swelling    FAMILY  HISTORY: Family History  Problem Relation Age of Onset  . Asthma Mother   . Cancer Mother     multiple myeloma  . Kidney disease Mother     related to chemo for treating multiple myeloma  . Hypertension Father   . Lupus Sister     affecting skin only  . Asthma Sister   . Diabetes Neg Hx   . Heart disease Neg Hx   . Arthritis Sister     related to MVA    SOCIAL HISTORY: Social History   Social History  . Marital Status: Single    Spouse Name: N/A  . Number of Children: 1  . Years of Education: N/A   Occupational History  . 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 Smoking status: Never Smoker   . Smokeless tobacco: Never Used  . Alcohol Use: 0.0 oz/week    0 Standard drinks or equivalent per week     Comment: Occ  . Drug Use: No  . Sexual Activity: Not Currently   Other Topics Concern  . Not on file   Social History Narrative   Lives with daughter, dog    REVIEW OF SYSTEMS: Constitutional: No fevers, chills, or sweats, no generalized fatigue, change in appetite Eyes: No visual changes, double vision, eye pain Ear, nose and throat: No hearing loss, ear pain, nasal congestion, sore throat Cardiovascular: No chest pain, palpitations Respiratory:  No shortness of breath at rest or with exertion, wheezes GastrointestinaI: No nausea, vomiting, diarrhea, abdominal pain, fecal incontinence Genitourinary:  No dysuria, urinary retention or frequency Musculoskeletal:  + neck pain, back pain Integumentary: No rash, pruritus, skin lesions Neurological: as above Psychiatric: No depression,+ insomnia,no anxiety Endocrine: No palpitations, fatigue, diaphoresis, mood swings, change in appetite, change in weight, increased thirst Hematologic/Lymphatic:  No anemia, purpura, petechiae. Allergic/Immunologic: no itchy/runny eyes, nasal congestion, recent allergic reactions, rashes  PHYSICAL EXAM: Filed Vitals:   04/08/15 1146  BP: 118/80   Pulse: 74  Resp: 16   General: No acute distress Head:  Normocephalic/atraumatic Neck: supple, no paraspinal tenderness, full range of motion Heart:  Regular rate and rhythm Lungs:  Clear to auscultation bilaterally Back: No paraspinal tenderness Skin/Extremities: No rash, no edema Neurological Exam: alert and oriented to person, place, and time. No aphasia or dysarthria. Fund of knowledge is appropriate.  Recent and remote memory are intact.  Attention and concentration are normal.    Able to name objects and repeat phrases. Cranial nerves: Pupils equal, round, reactive to light.  Fundoscopic exam unremarkable, no papilledema. Extraocular movements intact with no nystagmus. Visual fields full. Facial sensation intact. No facial asymmetry. Tongue, uvula, palate midline.  Motor: Bulk and tone normal, muscle strength 5/5 throughout with no pronator drift.  Sensation to light touch intact.  No extinction to double simultaneous stimulation.  Deep tendon reflexes 2+ throughout, toes downgoing.  Finger to nose testing intact.  Gait narrow-based and steady, able to tandem walk adequately.  Romberg negative.  IMPRESSION:  This is a 39 yo RH woman with a history of a convulsion 5 years ago, symptom-free for several years until recently when she started having recurrent nocturnal episodes that occur if she has not slept for a week and a half or longer. She would wake up feeling hot and sweaty, with her throat dry, not knowing what is going on. She has been told she would have shaking, she has woken up on the floor with incontinence and bruises. The etiology of her symptoms is unclear, her MRI brain and 48-hour EEG are normal, although typical events were not captured. She continues to have sleep difficulties. We discussed that since these episodes occur at times when she has not slept for a week, potentially treating insomnia will help prevent them. No clear indication to start anti-epileptic medication at this  time. She will be referred to a sleep specialist for the insomnia, in the meantime she was given a prescription for Ambien $RemoveBe'5mg'[id number]$  prn insomnia. Side effects were discussed, avoid driving when first starting medication. We discussed that if episodes continue despite better treatment of sleep, we may consider a therapeutic trial with anti-epileptic medication. She will keep a calendar of her symptoms and follow-up in 3 months. She is aware of [place] driving laws to stop driving after an episode of loss of consciousness/awareness until 6 months event-free.  Thank you for allowing me to participate in her care.  Please do not hesitate to call for any questions or concerns.  The duration of this appointment visit was 25 minutes of face-to-face time with the patient.  Greater than 50% of this time was spent in counseling, explanation of diagnosis, planning of further management, and coordination of care.   Linda Porter, M.D.   CC: Dr. Tomi Bamberger

## 2015-04-08 NOTE — Procedures (Signed)
ELECTROENCEPHALOGRAM REPORT  Dates of Recording: 03/23/2015 to 03/25/2015  Patient's Name: Linda Porter MRN: [id number] Date of Birth: 12-02-1975  Referring Provider: Dr. Ellouise Newer  Procedure: 48-hour ambulatory EEG  History: This is a 39 year old woman with recurrent nocturnal episodes where she wakes up hot and sweating, reportedly shaking, with urinary incontinence and bruises. EEG to assess for nocturnal seizures.  Medications: none  Technical Summary: This is a 48-hour multichannel digital EEG recording measured by the international 10-20 system with electrodes applied with paste and impedances below 5000 ohms performed as portable with EKG monitoring.  The digital EEG was referentially recorded, reformatted, and digitally filtered in a variety of bipolar and referential montages for optimal display.    DESCRIPTION OF RECORDING: During maximal wakefulness, the background activity consisted of a symmetric 10.5 Hz posterior dominant rhythm which was reactive to eye opening.  There were no epileptiform discharges or focal slowing seen in wakefulness.  During the recording, the patient progresses through wakefulness, drowsiness, and Stage 2 sleep.  Again, there were no epileptiform discharges seen.  Events: There were no push button events. Typical symptoms not reported.  There were no electrographic seizures seen.  EKG lead was unremarkable.  IMPRESSION: This 48-hour ambulatory EEG study is normal.    CLINICAL CORRELATION: A normal EEG does not exclude a clinical diagnosis of epilepsy. Typical events were not captured. If further clinical questions remain, inpatient video EEG monitoring may be helpful.   Ellouise Newer, M.D.

## 2015-04-16 ENCOUNTER — Institutional Professional Consult (permissible substitution): Payer: Self-pay | Admitting: Internal Medicine

## 2015-07-31 ENCOUNTER — Ambulatory Visit: Payer: Self-pay | Admitting: Neurology

## 2019-08-14 DIAGNOSIS — I10 Essential (primary) hypertension: Secondary | ICD-10-CM | POA: Insufficient documentation

## 2019-08-27 ENCOUNTER — Other Ambulatory Visit: Payer: Self-pay | Admitting: Nurse Practitioner

## 2019-08-27 DIAGNOSIS — Z1231 Encounter for screening mammogram for malignant neoplasm of breast: Secondary | ICD-10-CM

## 2019-11-14 ENCOUNTER — Ambulatory Visit
Admission: RE | Admit: 2019-11-14 | Discharge: 2019-11-14 | Disposition: A | Discharge status: Home / Self Care | Payer: 59 | Source: Ambulatory Visit | Attending: Nurse Practitioner | Admitting: Nurse Practitioner

## 2019-11-14 ENCOUNTER — Other Ambulatory Visit: Payer: Self-pay

## 2019-11-14 DIAGNOSIS — Z1231 Encounter for screening mammogram for malignant neoplasm of breast: Secondary | ICD-10-CM

## 2019-11-19 ENCOUNTER — Other Ambulatory Visit: Payer: Self-pay | Admitting: Nurse Practitioner

## 2019-11-19 ENCOUNTER — Other Ambulatory Visit: Payer: Self-pay | Admitting: Rheumatology

## 2019-11-19 DIAGNOSIS — R928 Other abnormal and inconclusive findings on diagnostic imaging of breast: Secondary | ICD-10-CM

## 2019-11-29 ENCOUNTER — Other Ambulatory Visit: Payer: 59

## 2019-12-05 ENCOUNTER — Ambulatory Visit: Payer: 59

## 2019-12-05 ENCOUNTER — Ambulatory Visit
Admission: RE | Admit: 2019-12-05 | Discharge: 2019-12-05 | Disposition: A | Discharge status: Home / Self Care | Payer: 59 | Source: Ambulatory Visit | Attending: Nurse Practitioner | Admitting: Nurse Practitioner

## 2019-12-05 ENCOUNTER — Other Ambulatory Visit: Payer: Self-pay

## 2019-12-05 DIAGNOSIS — R928 Other abnormal and inconclusive findings on diagnostic imaging of breast: Secondary | ICD-10-CM

## 2021-08-03 ENCOUNTER — Encounter: Payer: Self-pay | Admitting: Obstetrics and Gynecology

## 2021-08-03 NOTE — Progress Notes (Deleted)
? ?  ANNUAL EXAM ?Patient name: Linda Porter MRN [id number]  Date of birth: [birth year] ?Chief Complaint:   ?No chief complaint on file. ? ?History of Present Illness:   ?Linda Porter is a 46 y.o. No obstetric history on file. female being seen today for a routine annual exam.  She had  a TLH, bilateral salpingectomy 11/2019 for bleeding/fibroids at [facility].  ? ?Current complaints: *** ? ?No LMP recorded. ? ? ?The pregnancy intention screening data noted above was reviewed. Potential methods of contraception were discussed. The patient elected to proceed with No data recorded.  ? ?Last pap 07/2019. Results were:  normal . H/O abnormal pap: no ?Health Maintenance Due  ?Topic Date Due  ? HIV Screening  Never done  ? PAP SMEAR-Modifier  07/21/2014  ? COLONOSCOPY (Pts 45-18yr Insurance coverage will need to be confirmed)  Never done  ? COVID-19 Vaccine (4 - Booster for Pfizer series) 07/23/2020  ? INFLUENZA VACCINE  Never done  ? ? ? ?No flowsheet data found. ? No flowsheet data found. ? ? ?Review of Systems:   ?Pertinent items are noted in HPI ?Denies any headaches, blurred vision, fatigue, shortness of breath, chest pain, abdominal pain, abnormal vaginal discharge/itching/odor/irritation, problems with periods, bowel movements, urination, or intercourse unless otherwise stated above. *** ?Pertinent History Reviewed:  ?Reviewed past medical,surgical, social and family history.  ?Reviewed problem list, medications and allergies. ?Physical Assessment:  ?There were no vitals filed for this visit.There is no height or weight on file to calculate BMI. ?  ?Physical Examination:  ?General appearance - well appearing, and in no distress ?Mental status - alert, oriented to person, place, and time ?Psych:  She has a normal mood and affect ?Skin - warm and dry, normal color, no suspicious lesions noted ?Chest - effort normal, all lung fields clear to auscultation bilaterally ?Heart - normal rate and regular  rhythm ?Neck:  midline trachea, no thyromegaly or nodules ?Breasts - breasts appear normal, no suspicious masses, no skin or nipple changes or  axillary nodes ?Abdomen - soft, nontender, nondistended, no masses or organomegaly ?Pelvic -  ?VULVA: normal appearing vulva with no masses, tenderness or lesions   ?VAGINA: normal appearing vagina with normal color and discharge, no lesions   ?CERVIX: normal appearing cervix without discharge or lesions, no CMT ?UTERUS: uterus is felt to be normal size, shape, consistency and nontender  ?ADNEXA: No adnexal masses or tenderness noted. ?Extremities:  No swelling or varicosities noted ? ?Chaperone present for exam ? ?No results found for this or any previous visit (from the past 24 hour(s)).  ?Assessment & Plan:  ?Diagnoses and all orders for this visit: ? ?Encounter for annual routine gynecological examination ? ?- Cervical cancer screening: Discussed guidelines. Pap with HPV no longer indicated by history and having had a hysterectomy for non-pap related issues.  ?- STD Testing: {Blank single:19197::"accepts","declines","not indicated"} ?- Breast Health: Encouraged self breast awareness/SBE. Teaching provided. Discussed limits of clinical breast exam for detecting breast cancer. Rx given for MXR (last done 10/26/20) ?- F/U 12 months and prn  ? ? ? ? ?No orders of the defined types were placed in this encounter. ? ? ?Meds: No orders of the defined types were placed in this encounter. ? ? ?Follow-up: No follow-ups on file. ? ?PRadene Gunning MD ?08/03/2021 ?2:23 PM ?

## 2021-08-09 ENCOUNTER — Encounter: Payer: Self-pay | Admitting: Obstetrics and Gynecology

## 2021-08-09 DIAGNOSIS — Z01419 Encounter for gynecological examination (general) (routine) without abnormal findings: Secondary | ICD-10-CM

## 2021-08-25 NOTE — Progress Notes (Signed)
? ?New Patient Office Visit ? ?Subjective:  ?Patient ID: Linda Porter, female    DOB: 04/03/1976  Age: 45 y.o. MRN: [id number] ? ?CC:  ?Chief Complaint  ?Patient presents with  ? Establish Care  ? ? ? ?HPI ?Linda Porter presents to establish care. She is a pleasant 45-year-old female who has an adult daughter that lives with her. Notes her daughter has significant mental health concerns that are not well controlled. This is causing her a lot of stress as a mother and partial caregiver to her daughter. She is currently taking Sertraline 100mg daily and hydroxyzine 1-2 times daily as needed. Having trouble sleeping and doesn't feel like she has had any quality sleep in weeks. Has been taking Ambien 5mg nightly as needed with no noticeable benefit. Denies SI/HI. ? ?HTN- was put on Losartan-HCTZ 50-12.5mg about a year ago and is compliant with her medication. Notes that the medication was started after she had a high BP during an appointment. Reports that she was angry and stressed out at this appointment and that was why her BP was high. Wonders if she can come off the medication at this point since she has started exercising and made some dietary changes.  ? ?Was diagnosed with asthma in her 30s. She has albuterol inhaler and nebulizer to use prn which are helpful. Only uses the nebulizer for severe symptoms (averages 2 times a month). Symptoms have flared recently with the increased pollen and she would like her inhaler refilled.  ? ?Past Medical History:  ?Diagnosis Date  ? Asthma   ? Eczema   ? Flat feet   ? GERD (gastroesophageal reflux disease)   ? Leg cramps   ? Lumbago   ? Neuropathy   ? Seizure (HCC) 06/29/2009  ? saw neuro (felt to be related to a med she was taking)  ? ? ?Past Surgical History:  ?Procedure Laterality Date  ? BREAST BIOPSY    ? left breast at [facility] 2019-benign per pt  ? CESAREAN SECTION    ? x 1  ? LAPAROSCOPIC TOTAL HYSTERECTOMY    ? bilateral salpingectomy   ? ? ?Family History  ?Problem Relation Age of Onset  ? Asthma Mother   ? Cancer Mother   ?     multiple myeloma  ? Kidney disease Mother   ?     related to chemo for treating multiple myeloma  ? Hypertension Father   ? Lupus Sister   ?     affecting skin only  ? Asthma Sister   ? Arthritis Sister   ?     related to MVA  ? Diabetes Neg Hx   ? Heart disease Neg Hx   ? ? ?Social History  ? ?Socioeconomic History  ? Marital status: Single  ?  Spouse name: Not on file  ? Number of children: 1  ? Years of education: Not on file  ? Highest education level: Not on file  ?Occupational History  ? Occupation: nanny; cleans at [employer]  ?  Employer: [employer]  ?Tobacco Use  ? Smoking status: Never  ? Smokeless tobacco: Never  ?Substance and Sexual Activity  ? Alcohol use: Yes  ?  Alcohol/week: 0.0 standard drinks  ?  Comment: Occ  ? Drug use: No  ? Sexual activity: Not Currently  ?Other Topics Concern  ? Not on file  ?Social History Narrative  ? Lives with daughter, dog  ? ?Social Determinants of Health  ? ?Financial   Resource Strain: Not on file  ?Food Insecurity: Not on file  ?Transportation Needs: Not on file  ?Physical Activity: Not on file  ?Stress: Not on file  ?Social Connections: Not on file  ?Intimate Partner Violence: Not on file  ? ? ?ROS ?Review of Systems  ?Constitutional:  Negative for chills, fatigue, fever and unexpected weight change.  ?HENT:  Negative for congestion, rhinorrhea, sinus pressure and sore throat.   ?Respiratory:  Positive for chest tightness and wheezing. Negative for cough and shortness of breath.   ?Cardiovascular:  Negative for chest pain, palpitations and leg swelling.  ?Gastrointestinal:  Negative for abdominal pain, constipation, diarrhea, nausea and vomiting.  ?Endocrine: Negative for cold intolerance and heat intolerance.  ?Genitourinary:  Negative for dysuria, frequency, urgency, vaginal bleeding and vaginal discharge.  ?Skin:  Negative for rash and wound.  ?Neurological:   Negative for dizziness, light-headedness and headaches.  ?Hematological:  Does not bruise/bleed easily.  ?Psychiatric/Behavioral:  Positive for dysphoric mood and sleep disturbance. Negative for self-injury and suicidal ideas. The patient is nervous/anxious.   ? ?Objective:  ? ?Today's Vitals: BP 105/70   Pulse (!) 55   Wt 242 lb 9.6 oz (110 kg)   LMP 11/07/2019   SpO2 99%   BMI 44.37 kg/m?  ? ?Physical Exam ?Vitals reviewed.  ?Constitutional:   ?   General: She is not in acute distress. ?   Appearance: Normal appearance.  ?HENT:  ?   Head: Normocephalic and atraumatic.  ?Cardiovascular:  ?   Rate and Rhythm: Normal rate and regular rhythm.  ?   Pulses: Normal pulses.  ?   Heart sounds: Normal heart sounds. No murmur heard. ?  No friction rub. No gallop.  ?Pulmonary:  ?   Effort: Pulmonary effort is normal. No respiratory distress.  ?   Breath sounds: Normal breath sounds. No wheezing.  ?Skin: ?   General: Skin is warm and dry.  ?Neurological:  ?   Mental Status: She is alert and oriented to person, place, and time.  ?Psychiatric:     ?   Mood and Affect: Mood normal.     ?   Behavior: Behavior normal.     ?   Thought Content: Thought content normal.     ?   Judgment: Judgment normal.  ? ? ?Assessment & Plan:  ? ?1. Encounter to establish care ?Reviewed available information and discussed care concerns with patient.  ? ?2. Essential hypertension ?Checking labs. BP very well controlled today. Discussed plan to evaluate coming off BP medications. Sending in Losartan 44m daily. Recommend checking BP daily with a goal of 130/80 or less. If BP remains stable, we can certainly look at decreasing dose. Discussed continued exercise and weight loss efforts. Reviewed daily intake and recommend slightly increasing calories but focus on adequate protein.  ?- CBC with Differential/Platelet ?- COMPLETE METABOLIC PANEL WITH GFR ?- Lipid panel ? ?3. GAD (generalized anxiety disorder) ?Referring to behavioral health for  counseling. Continue Sertraline 1040mdaily. Continue prn hydroxyzine. Starting Trazodone 25-50100mightly prn sleep.  ?- [facility]?4. Abnormal mammogram ?Diagnostic mammogram ordered.  ?- MM DIAG BREAST TOMO BILATERAL; Future ? ?5. Status post total hysterectomy ?Doing well. No concerns. ? ?6. Thyroid disorder screen ?Checking TSH.  ?- TSH ? ?7. Diabetes mellitus screening ?Checking A1c.  ?- Hemoglobin A1c ? ?8. Skin lesion ?Referring to dermatology. ?- Ambulatory referral to Dermatology ? ? ?Outpatient Encounter Medications as of 08/26/2021  ?Medication Sig  ?  losartan (COZAAR) 50 MG tablet Take 1 tablet (50 mg total) by mouth daily.  ? traZODone (DESYREL) 50 MG tablet Take 0.5-1 tablets (25-50 mg total) by mouth at bedtime as needed for sleep.  ? albuterol (PROAIR HFA) 108 (90 Base) MCG/ACT inhaler Inhale 1-2 puffs into the lungs every 6 (six) hours as needed for wheezing or shortness of breath.  ? diclofenac (FLECTOR) 1.3 % PTCH Place 1 patch onto the skin. Twice a week  ? famotidine (PEPCID) 20 MG tablet Take by mouth.  ? ferrous sulfate 325 (65 FE) MG tablet Take by mouth.  ? hydrOXYzine (ATARAX) 25 MG tablet Take 1 tablet by mouth twice daily as needed for anxiety and sleep  ? ibuprofen (ADVIL,MOTRIN) 200 MG tablet Take 200 mg by mouth. Takes as needed for pain  ? loratadine (CLARITIN) 10 MG tablet Take by mouth.  ? losartan-hydrochlorothiazide (HYZAAR) 50-12.5 MG tablet Take 1 tablet by mouth daily.  ? norethindrone (AYGESTIN) 5 MG tablet 2 pills daily  ? sertraline (ZOLOFT) 100 MG tablet Take 1 tablet (100 mg total) by mouth daily.  ? tiZANidine (ZANAFLEX) 4 MG tablet Take by mouth.  ? zolpidem (AMBIEN) 5 MG tablet Take 1 tablet (5 mg total) by mouth at bedtime as needed for sleep.  ? [DISCONTINUED] albuterol (PROAIR HFA) 108 (90 BASE) MCG/ACT inhaler Inhale 1-2 puffs into the lungs every 6 (six) hours as needed for wheezing or shortness of breath.  ? [DISCONTINUED]  hydrOXYzine (ATARAX) 25 MG tablet Take 1 tablet by mouth twice daily as needed for anxiety and sleep  ? [DISCONTINUED] sertraline (ZOLOFT) 100 MG tablet Take by mouth.  ? ?No facility-administered encounter medications on file as of 4/

## 2021-08-26 ENCOUNTER — Ambulatory Visit (INDEPENDENT_AMBULATORY_CARE_PROVIDER_SITE_OTHER): Payer: Managed Care, Other (non HMO) | Admitting: Medical-Surgical

## 2021-08-26 ENCOUNTER — Encounter: Payer: Self-pay | Admitting: Medical-Surgical

## 2021-08-26 VITALS — BP 105/70 | HR 55 | Ht 63.0 in | Wt 242.6 lb

## 2021-08-26 DIAGNOSIS — R928 Other abnormal and inconclusive findings on diagnostic imaging of breast: Secondary | ICD-10-CM | POA: Diagnosis not present

## 2021-08-26 DIAGNOSIS — Z90710 Acquired absence of both cervix and uterus: Secondary | ICD-10-CM

## 2021-08-26 DIAGNOSIS — Z1329 Encounter for screening for other suspected endocrine disorder: Secondary | ICD-10-CM

## 2021-08-26 DIAGNOSIS — I10 Essential (primary) hypertension: Secondary | ICD-10-CM

## 2021-08-26 DIAGNOSIS — Z7689 Persons encountering health services in other specified circumstances: Secondary | ICD-10-CM

## 2021-08-26 DIAGNOSIS — Z131 Encounter for screening for diabetes mellitus: Secondary | ICD-10-CM

## 2021-08-26 DIAGNOSIS — L989 Disorder of the skin and subcutaneous tissue, unspecified: Secondary | ICD-10-CM

## 2021-08-26 DIAGNOSIS — F411 Generalized anxiety disorder: Secondary | ICD-10-CM | POA: Diagnosis not present

## 2021-08-26 DIAGNOSIS — M47816 Spondylosis without myelopathy or radiculopathy, lumbar region: Secondary | ICD-10-CM

## 2021-08-26 MED ORDER — HYDROXYZINE HCL 25 MG PO TABS: ORAL_TABLET | ORAL | 1 refills | Status: DC

## 2021-08-26 MED ORDER — ALBUTEROL SULFATE HFA 108 (90 BASE) MCG/ACT IN AERS: 1.0000 | INHALATION_SPRAY | Freq: Four times a day (QID) | RESPIRATORY_TRACT | 5 refills | Status: AC | PRN

## 2021-08-26 MED ORDER — TRAZODONE HCL 50 MG PO TABS: 25.0000 mg | ORAL_TABLET | Freq: Every evening | ORAL | 3 refills | Status: DC | PRN

## 2021-08-26 MED ORDER — SERTRALINE HCL 100 MG PO TABS: 100.0000 mg | ORAL_TABLET | Freq: Every day | ORAL | 1 refills | Status: DC

## 2021-08-26 MED ORDER — LOSARTAN POTASSIUM 50 MG PO TABS: 50.0000 mg | ORAL_TABLET | Freq: Every day | ORAL | 0 refills | Status: DC

## 2021-08-26 NOTE — Patient Instructions (Addendum)
Calories- 1300 ?Protein- 100g ?Carbs- 78g ?Fat- 65g ? ?Advance Directive ?Advance directives are legal documents that allow you to make decisions about your health care and medical treatment in case you become unable to communicate for yourself. Advance directives let your wishes be known to family, friends, and health care providers. ?Discussing and writing advance directives should happen over time rather than all at once. Advance directives can be changed and updated at any time. There are different types of advance directives, such as: ?Medical power of attorney. ?Living will. ?Do not resuscitate (DNR) order or do not attempt resuscitation (DNAR) order. ?Health care proxy and medical power of attorney ?A health care proxy is also called a health care agent. This person is appointed to make medical decisions for you when you are unable to make decisions for yourself. Generally, people ask a trusted friend or family member to act as their proxy and represent their preferences. Make sure you have an agreement with your trusted person to act as your proxy. A proxy may have to make a medical decision on your behalf if your wishes are not known. ?A medical power of attorney, also called a durable power of attorney for health care, is a legal document that names your health care proxy. Depending on the laws in your state, the document may need to be: ?Signed. ?Notarized. ?Dated. ?Copied. ?Witnessed. ?Incorporated into your medical record. ?You may also want to appoint a trusted person to manage your money in the event you are unable to do so. This is called a durable power of attorney for finances. It is a separate legal document from the durable power of attorney for health care. You may choose your health care proxy or someone different to act as your agent in money matters. ?If you do not appoint a proxy, or there is a concern that the proxy is not acting in your best interest, a court may appoint a guardian to act  on your behalf. ?Living will ?A living will is a set of instructions that state your wishes about medical care when you cannot express them yourself. Health care providers should keep a copy of your living will in your medical record. You may want to give a copy to family members or friends. To alert caregivers in case of an emergency, you can place a card in your wallet to let them know that you have a living will and where they can find it. A living will is used if you become: ?Terminally ill. ?Disabled. ?Unable to communicate or make decisions. ?The following decisions should be included in your living will: ?To use or not to use life support equipment, such as dialysis machines and breathing machines (ventilators). ?Whether you want a DNR or DNAR order. This tells health care providers not to use cardiopulmonary resuscitation (CPR) if breathing or heartbeat stops. ?To use or not to use tube feeding. ?To be given or not to be given food and fluids. ?Whether you want comfort (palliative) care when the goal becomes comfort rather than a cure. ?Whether you want to donate your organs and tissues. ?A living will does not give instructions for distributing your money and property if you should pass away. ?DNR or DNAR ?A DNR or DNAR order is a request not to have CPR in the event that your heart stops beating or you stop breathing. If a DNR or DNAR order has not been made and shared, a health care provider will try to help any patient whose  heart has stopped or who has stopped breathing. If you plan to have surgery, talk with your health care provider about how your DNR or DNAR order will be followed if problems occur. ?What if I do not have an advance directive? ?Some states assign family decision makers to act on your behalf if you do not have an advance directive. Each state has its own laws about advance directives. You may want to check with your health care provider, attorney, or state representative about the  laws in your state. ?Summary ?Advance directives are legal documents that allow you to make decisions about your health care and medical treatment in case you become unable to communicate for yourself. ?The process of discussing and writing advance directives should happen over time. You can change and update advance directives at any time. ?Advance directives may include a medical power of attorney, a living will, and a DNR or DNAR order. ?This information is not intended to replace advice given to you by your health care provider. Make sure you discuss any questions you have with your health care provider. ?Document Revised: 02/04/2020 Document Reviewed: 02/04/2020 ?Elsevier Patient Education ? [place]. ? ?

## 2021-08-27 ENCOUNTER — Encounter: Payer: Self-pay | Admitting: Women's Health

## 2021-08-27 ENCOUNTER — Encounter: Payer: Self-pay | Admitting: Medical-Surgical

## 2021-08-27 ENCOUNTER — Other Ambulatory Visit (HOSPITAL_COMMUNITY)
Admission: RE | Admit: 2021-08-27 | Discharge: 2021-08-27 | Disposition: A | Discharge status: Home / Self Care | Payer: Managed Care, Other (non HMO) | Source: Ambulatory Visit | Attending: Women's Health | Admitting: Women's Health

## 2021-08-27 ENCOUNTER — Ambulatory Visit (INDEPENDENT_AMBULATORY_CARE_PROVIDER_SITE_OTHER): Payer: Managed Care, Other (non HMO) | Admitting: Women's Health

## 2021-08-27 ENCOUNTER — Other Ambulatory Visit: Payer: Self-pay | Admitting: Medical-Surgical

## 2021-08-27 VITALS — BP 115/76 | HR 52 | Resp 16 | Ht 62.0 in | Wt 242.0 lb

## 2021-08-27 DIAGNOSIS — R350 Frequency of micturition: Secondary | ICD-10-CM

## 2021-08-27 DIAGNOSIS — Z90710 Acquired absence of both cervix and uterus: Secondary | ICD-10-CM | POA: Diagnosis not present

## 2021-08-27 DIAGNOSIS — Z01419 Encounter for gynecological examination (general) (routine) without abnormal findings: Secondary | ICD-10-CM | POA: Insufficient documentation

## 2021-08-27 DIAGNOSIS — Z202 Contact with and (suspected) exposure to infections with a predominantly sexual mode of transmission: Secondary | ICD-10-CM

## 2021-08-27 LAB — CBC WITH DIFFERENTIAL/PLATELET
Absolute Monocytes: 310 cells/uL (ref 200–950)
Basophils Absolute: 40 cells/uL (ref 0–200)
Basophils Relative: 1.1 %
Eosinophils Absolute: 68 cells/uL (ref 15–500)
Eosinophils Relative: 1.9 %
HCT: 37.7 % (ref 35.0–45.0)
Hemoglobin: 12.3 g/dL (ref 11.7–15.5)
Lymphs Abs: 1451 cells/uL (ref 850–3900)
MCH: 28 pg (ref 27.0–33.0)
MCHC: 32.6 g/dL (ref 32.0–36.0)
MCV: 85.7 fL (ref 80.0–100.0)
MPV: 10.3 fL (ref 7.5–12.5)
Monocytes Relative: 8.6 %
Neutro Abs: 1732 cells/uL (ref 1500–7800)
Neutrophils Relative %: 48.1 %
Platelets: 232 10*3/uL (ref 140–400)
RBC: 4.4 10*6/uL (ref 3.80–5.10)
RDW: 12.7 % (ref 11.0–15.0)
Total Lymphocyte: 40.3 %
WBC: 3.6 10*3/uL — ABNORMAL LOW (ref 3.8–10.8)

## 2021-08-27 LAB — COMPLETE METABOLIC PANEL WITH GFR
AG Ratio: 1.7 (calc) (ref 1.0–2.5)
ALT: 25 U/L (ref 6–29)
AST: 25 U/L (ref 10–35)
Albumin: 4 g/dL (ref 3.6–5.1)
Alkaline phosphatase (APISO): 66 U/L (ref 31–125)
BUN: 16 mg/dL (ref 7–25)
CO2: 29 mmol/L (ref 20–32)
Calcium: 9.4 mg/dL (ref 8.6–10.2)
Chloride: 102 mmol/L (ref 98–110)
Creat: 0.94 mg/dL (ref 0.50–0.99)
Globulin: 2.4 g/dL (calc) (ref 1.9–3.7)
Glucose, Bld: 89 mg/dL (ref 65–99)
Potassium: 3.9 mmol/L (ref 3.5–5.3)
Sodium: 138 mmol/L (ref 135–146)
Total Bilirubin: 0.6 mg/dL (ref 0.2–1.2)
Total Protein: 6.4 g/dL (ref 6.1–8.1)
eGFR: 75 mL/min/{1.73_m2} (ref >=60)

## 2021-08-27 LAB — TSH: TSH: 2.11 mIU/L

## 2021-08-27 LAB — HEMOGLOBIN A1C
Hgb A1c MFr Bld: 5.2 % of total Hgb (ref <5.7)
Mean Plasma Glucose: 103 mg/dL
eAG (mmol/L): 5.7 mmol/L

## 2021-08-27 LAB — LIPID PANEL
Cholesterol: 195 mg/dL (ref <200)
HDL: 60 mg/dL (ref >=50)
LDL Cholesterol (Calc): 121 mg/dL (calc) — ABNORMAL HIGH
Non-HDL Cholesterol (Calc): 135 mg/dL (calc) — ABNORMAL HIGH (ref <130)
Total CHOL/HDL Ratio: 3.3 (calc) (ref <5.0)
Triglycerides: 50 mg/dL (ref <150)

## 2021-08-27 NOTE — Progress Notes (Signed)
? ? ?GYNECOLOGY ANNUAL PREVENTATIVE CARE ENCOUNTER NOTE ? ?History:    ? Caral Michele Geeslin is a 46 y.o. G2P1 female here for a routine annual gynecologic exam.  Current complaints: pain during sex only with deep penetration, pressure with urination and occasionally during sex, and sometimes with position changes - pressure then results in patient feeling like she has to urinate immediately. Denies abnormal vaginal bleeding, discharge, pelvic pain.. ?Pt desires STD testing today. ?Pt reports she does perform SBE. ?Pt denies other bowel or bladder concerns outside of above. ?No family hx of breast, endometrial or ovarian cancer. Paternal grandfather - colon cancer, 68-69yo. ?Pt does not smoke, use drugs. Patient drinks socially. ?   ? ?  ?Gynecologic History ?Patient's last menstrual period was 11/06/2020. Pt denies bleeding since. Patient had hysterectomy with removal of cervix per pathology note. ?Last Pap: 08/14/2019. Results were: normal per [facility]. Pt reports history of abnormal Pap a long time ago, does not know what was abnormal about it, but states she did not have to have a colposcopy or other procedure. ?Last mammogram: 10/2020. Results were: normal ?Last colonoscopy: never, getting scheduled this year. ? ?Obstetric History ?OB History  ?Gravida Para Term Preterm AB Living  ?2 1       1  ?SAB IAB Ectopic Multiple Live Births  ?        1  ?  ?# Outcome Date GA Lbr Len/2nd Weight Sex Delivery Anes PTL Lv  ?2 Gravida           ?1 Para           ? ? ?Past Medical History:  ?Diagnosis Date  ? Allergy   ? Anemia   ? Anxiety   ? Arthritis   ? Asthma   ? Eczema   ? Flat feet   ? GERD (gastroesophageal reflux disease)   ? Hypertension   ? Leg cramps   ? Lumbago   ? Neuropathy   ? Seizure (HCC) 06/29/2009  ? saw neuro (felt to be related to a med she was taking)  ? Vaginal Pap smear, abnormal   ? ? ?Past Surgical History:  ?Procedure Laterality Date  ? ABDOMINAL HYSTERECTOMY  June 2022  ? BREAST BIOPSY     ? left breast at [facility] 2019-benign per pt  ? CESAREAN SECTION    ? x 1  ? LAPAROSCOPIC TOTAL HYSTERECTOMY    ? bilateral salpingectomy  ? SPINE SURGERY  June 2022  ? ? ?Current Outpatient Medications on File Prior to Visit  ?Medication Sig Dispense Refill  ? albuterol (PROAIR HFA) 108 (90 Base) MCG/ACT inhaler Inhale 1-2 puffs into the lungs every 6 (six) hours as needed for wheezing or shortness of breath. 18 g 5  ? hydrOXYzine (ATARAX) 25 MG tablet Take 1 tablet by mouth twice daily as needed for anxiety and sleep 180 tablet 1  ? ibuprofen (ADVIL,MOTRIN) 200 MG tablet Take 200 mg by mouth. Takes as needed for pain    ? losartan (COZAAR) 50 MG tablet Take 1 tablet (50 mg total) by mouth daily. 90 tablet 0  ? sertraline (ZOLOFT) 100 MG tablet Take 1 tablet (100 mg total) by mouth daily. 90 tablet 1  ? traZODone (DESYREL) 50 MG tablet Take 0.5-1 tablets (25-50 mg total) by mouth at bedtime as needed for sleep. 30 tablet 3  ? zolpidem (AMBIEN) 5 MG tablet Take 1 tablet (5 mg total) by mouth at bedtime as needed for sleep. 30 tablet 1  ? ?  No current facility-administered medications on file prior to visit.  ? ? ?Allergies  ?Allergen Reactions  ? Iodine Swelling  ? Other Other (See Comments)  ?  SEAFOOD ALLERGY   ? Nerve Block Tray Swelling  ? ? ?Social History:  reports that she has never smoked. She has never used smokeless tobacco. She reports current alcohol use. She reports that she does not use drugs. ? ?Family History  ?Problem Relation Age of Onset  ? Asthma Mother   ? Cancer Mother   ?     multiple myeloma  ? Kidney disease Mother   ?     related to chemo for treating multiple myeloma  ? Hypertension Father   ? Lupus Sister   ?     affecting skin only  ? Asthma Sister   ? Arthritis Sister   ?     related to MVA  ? Diabetes Neg Hx   ? Heart disease Neg Hx   ? ? ?The following portions of the patient's history were reviewed and updated as appropriate: allergies, current medications, past family history,  past medical history, past social history, past surgical history and problem list. ? ?Review of Systems ?Pertinent items noted in HPI and remainder of comprehensive ROS otherwise negative. ? ?Physical Exam:  ?BP 115/76   Pulse (!) 52   Resp 16   Ht 5' 2" (1.575 m)   Wt 242 lb (109.8 kg)   LMP 11/07/2019   BMI 44.26 kg/m?  ? ?CONSTITUTIONAL: Well-developed, well-nourished female in no acute distress.  ?HENT:  Normocephalic, atraumatic, External right and left ear normal. ?EYES: Conjunctivae and EOM are normal. Pupils are equal, round, and reactive to light. No scleral icterus.  ?NECK: Normal range of motion, supple, no masses.  Normal thyroid.  ?SKIN: Skin is warm and dry. No rash noted. Not diaphoretic. No erythema. No pallor. ?MUSCULOSKELETAL: Normal range of motion. No tenderness.  No cyanosis, clubbing, or edema. ?NEUROLOGIC: Alert and oriented to person, place, and time. Normal reflexes, muscle tone coordination. ?PSYCHIATRIC: Normal mood and affect. Normal behavior. Normal judgment and thought content. ?CARDIOVASCULAR: Normal heart rate noted, regular rhythm. ?RESPIRATORY: Clear to auscultation bilaterally. Effort and breath sounds normal, no problems with respiration noted. ?BREASTS: Symmetric in size. No masses, skin changes, nipple drainage, or lymphadenopathy. ?ABDOMEN: Soft, normal bowel sounds, no distention noted.  No tenderness, rebound or guarding.  ?PELVIC: Normal appearing external genitalia, swab collected by provider. ?  ?Assessment and Plan:  ?1. Well woman exam ? ?2. Frequent urination/pressure with urination ?- Urine Culture ?- if urine culture negative, will refer to [place] ?- if urine culture positive, will treat and see if sx resolve, and if not refer to [place] ? ?3. H/O: hysterectomy ?-cervix removed ? ?4. Exposure to sexually transmitted disease (STD) ?- HIV antibody (with reflex) ?- RPR ?- Hepatitis B Surface AntiGEN ?- Hepatitis C Antibody ?- Cervicovaginal ancillary only( [facility]) ? ?Will follow up results of pap smear and other testing, if performed, and manage accordingly. ?Mammogram to be scheduled for summer by ordering provider. ?Routine preventative health maintenance measures emphasized. ?Self-breast awareness taught, importance discussed, advised when to RTC, SBA literature given. ?Please refer to After Visit Summary for other counseling recommendations.  ?   ? ?Colleen Can Chestina Komatsu, WHNP-BC ?Women's Health Nurse Practitioner, Faculty Practice ?[facility] ?

## 2021-08-27 NOTE — Progress Notes (Signed)
Mammogram schedule in summer ? ?Colonoscopy to be scheduled ?

## 2021-08-28 LAB — URINE CULTURE
MICRO NUMBER:: 13266017
SPECIMEN QUALITY:: ADEQUATE

## 2021-08-30 LAB — HEPATITIS B SURFACE ANTIGEN: Hepatitis B Surface Ag: NONREACTIVE

## 2021-08-30 LAB — RPR: RPR Ser Ql: NONREACTIVE

## 2021-08-30 LAB — HEPATITIS C ANTIBODY
Hepatitis C Ab: NONREACTIVE
SIGNAL TO CUT-OFF: 0.18 (ref <1.00)

## 2021-08-30 LAB — HIV ANTIBODY (ROUTINE TESTING W REFLEX): HIV 1&2 Ab, 4th Generation: NONREACTIVE

## 2021-08-30 MED ORDER — PHENTERMINE HCL 15 MG PO CAPS: 15.0000 mg | ORAL_CAPSULE | ORAL | 0 refills | Status: DC

## 2021-08-31 LAB — CERVICOVAGINAL ANCILLARY ONLY
Chlamydia: NEGATIVE
Comment: NEGATIVE
Comment: NEGATIVE
Comment: NORMAL
Neisseria Gonorrhea: NEGATIVE
Trichomonas: NEGATIVE

## 2021-09-04 ENCOUNTER — Other Ambulatory Visit: Payer: Self-pay | Admitting: Women's Health

## 2021-09-04 DIAGNOSIS — R3915 Urgency of urination: Secondary | ICD-10-CM

## 2021-09-09 ENCOUNTER — Ambulatory Visit (INDEPENDENT_AMBULATORY_CARE_PROVIDER_SITE_OTHER): Payer: Managed Care, Other (non HMO) | Admitting: Medical-Surgical

## 2021-09-09 VITALS — BP 115/64 | HR 52

## 2021-09-09 DIAGNOSIS — I10 Essential (primary) hypertension: Secondary | ICD-10-CM

## 2021-09-09 NOTE — Progress Notes (Signed)
Agree with documentation as above.  ? ?___________________________________________ ?Hendricks Schwandt L. Dajha Urquilla, DNP, APRN, FNP-BC ?Primary Care and Sports Medicine ?[facility] ? ?

## 2021-09-09 NOTE — Telephone Encounter (Signed)
She can do a weight check with the blood pressure check on the 11th but her next visit with me will need to be in office 4 weeks after that. Advise her to schedule asap to avoid the issue of no available appointments.

## 2021-09-09 NOTE — Progress Notes (Signed)
Patient comes in today for blood pressure check.  ? ?Linda Porter is taking Losartan 50 mg daily for blood pressure control. She denies any missed doses, side effects, headaches, chest pain, palpitations, dizziness, or shortness of breath.  ? ?At her last visit with [name] on 08/26/2021 her blood pressure was 105/70. She wants to come off her medication. At that time her HCTZ 12.5 mg was stopped.  ? ?[name] hasn't been checking blood pressure readings at home. ? ?Her first blood pressure reading today is: 133/62. ?After sitting, her second reading is: 115/64.  ? ?I spoke with [name] who advised patient can go down to Losartan 25 mg daily if she keeps an eye on blood pressures at home. She is going to schedule a two week follow up appointment to recheck blood pressure and weight (since starting phentermine). She states she knows she will not be able to check blood pressures at home. Encouraged her to try if she can and watch out for chest pain, palpitations, dizziness, headaches and shortness of breath.   ? ?

## 2021-09-18 ENCOUNTER — Other Ambulatory Visit: Payer: Self-pay | Admitting: Medical-Surgical

## 2021-09-23 ENCOUNTER — Ambulatory Visit (INDEPENDENT_AMBULATORY_CARE_PROVIDER_SITE_OTHER): Payer: Commercial Managed Care - HMO | Admitting: Medical-Surgical

## 2021-09-23 DIAGNOSIS — Z1231 Encounter for screening mammogram for malignant neoplasm of breast: Secondary | ICD-10-CM

## 2021-09-23 MED ORDER — PHENTERMINE HCL 15 MG PO CAPS: 15.0000 mg | ORAL_CAPSULE | ORAL | 0 refills | Status: DC

## 2021-09-23 MED ORDER — LOSARTAN POTASSIUM 25 MG PO TABS: 25.0000 mg | ORAL_TABLET | Freq: Every day | ORAL | 0 refills | Status: DC

## 2021-09-23 NOTE — Progress Notes (Signed)
Agree with documentation as below.  ___________________________________________ Kianah Harries L. Deannah Rossi, DNP, APRN, FNP-BC Primary Care and Sports Medicine [facility]  

## 2021-09-23 NOTE — Progress Notes (Signed)
? ?  Established Patient Office Visit ? ?Subjective   ?Patient ID: Linda Porter, female    DOB: [birth year]  Age: 46 y.o. MRN: [id number] ? ?Chief Complaint  ?Patient presents with  ? Weight Check  ? ? ?HPI ? ?Linda Porter is here for weight and blood pressure check. Denies chest pain, shortness of breath or dizziness.  ? ?She denies any problems with phentermine. No trouble sleeping or palpitations.  ? ?ROS ? ?  ?Objective:  ?  ? ?BP 121/64   Pulse 61   Wt 238 lb (108 kg)   LMP 11/07/2019   SpO2 100%   BMI 43.53 kg/m?  ? ? ?Physical Exam ? ? ?No results found for any visits on 09/23/21. ? ? ? ?The 10-year ASCVD risk score (Arnett DK, et al., 2019) is: 1% ? ?  ?Assessment & Plan:  ?Hypertension - Per [name], patient advised to reduce Losartan to 25 mg daily. She will need a prescription sent to the pharmacy. ? ?Obesity - She has lost weight. She will need a refill sent to the pharmacy.  ? ?Problem List Items Addressed This Visit   ? ? Obesity, morbid, BMI 40.0-49.9 ([name]) - Primary  ? ?Other Visit Diagnoses   ? ? Screening mammogram for breast cancer      ? Relevant Orders  ? MM 3D SCREEN BREAST BILATERAL  ? ?  ? ? ?Return in about 2 weeks (around 10/07/2021) for blood pressure check. .  ? ? ?Lavell Luster, [place] ? ?

## 2021-10-07 ENCOUNTER — Ambulatory Visit (INDEPENDENT_AMBULATORY_CARE_PROVIDER_SITE_OTHER): Payer: Commercial Managed Care - HMO | Admitting: Medical-Surgical

## 2021-10-07 DIAGNOSIS — I10 Essential (primary) hypertension: Secondary | ICD-10-CM

## 2021-10-07 NOTE — Progress Notes (Signed)
Agree with documentation as below.  ___________________________________________ Yalena Colon L. Gesenia Bantz, DNP, APRN, FNP-BC Primary Care and Sports Medicine [facility]  

## 2021-10-07 NOTE — Progress Notes (Signed)
Patient comes in today for blood pressure check.   Linda Porter is taking Losartan 25 mg daily for blood pressure control. This was decreased two weeks ago from 50 mg daily. She denies any missed doses, side effects, chest pain,  dizziness, or shortness of breath.   She is also taking Phentermine 15 mg daily. She does report some headaches and occasional palpitations. These only happen "every now and then" and believes they are related to anxiety and heat. She was made aware the Phentermine can contribute to these symptoms and she will keep an eye on these.   Linda Porter hasn't been checking blood pressure readings at home.  Her blood pressure reading today is: 127/65. Her ultimate goal is to come off blood pressure medication.  I spoke with [name] who advised that patient can decrease Losartan to a half tablet daily. She has a scheduled appointment next week with [name] and we will recheck her blood pressure at that time. She will call sooner with any increase in symptoms.

## 2021-10-14 NOTE — Progress Notes (Unsigned)
   Established Patient Office Visit  Subjective   Patient ID: Caycee Wanat, female   DOB: Oct 28, 1975 Age: 46 y.o. MRN: [id number]   No chief complaint on file.   HPI Pleasant 46 year old female presenting today for the following:  Hypertension-  Weight management-  ROS    Objective:    There were no vitals filed for this visit.   Physical Exam    No results found for this or any previous visit (from the past 24 hour(s)).   {Labs (Optional):23779}  The 10-year ASCVD risk score (Arnett DK, et al., 2019) is: 2.4%   Values used to calculate the score:     Age: 63 years     Sex: Female     Is Non-Hispanic African American: Yes     Diabetic: No     Tobacco smoker: No     Systolic Blood Pressure: 419 mmHg     Is BP treated: Yes     HDL Cholesterol: 60 mg/dL     Total Cholesterol: 195 mg/dL   Assessment & Plan:   No problem-specific Assessment & Plan notes found for this encounter.   No follow-ups on file.  ___________________________________________ Clearnce Sorrel, DNP, APRN, FNP-BC Primary Care and [place]

## 2021-10-15 ENCOUNTER — Ambulatory Visit (INDEPENDENT_AMBULATORY_CARE_PROVIDER_SITE_OTHER): Payer: Commercial Managed Care - HMO | Admitting: Medical-Surgical

## 2021-10-15 ENCOUNTER — Encounter: Payer: Self-pay | Admitting: Medical-Surgical

## 2021-10-15 VITALS — BP 115/76 | HR 60 | Resp 20 | Ht 62.0 in | Wt 234.7 lb

## 2021-10-15 DIAGNOSIS — Z1211 Encounter for screening for malignant neoplasm of colon: Secondary | ICD-10-CM

## 2021-10-15 DIAGNOSIS — I10 Essential (primary) hypertension: Secondary | ICD-10-CM | POA: Diagnosis not present

## 2021-10-15 MED ORDER — PHENTERMINE HCL 37.5 MG PO TABS: ORAL_TABLET | ORAL | 0 refills | Status: DC

## 2021-10-15 NOTE — Assessment & Plan Note (Signed)
Blood pressure looks fabulous today on 12.5 mg of losartan, even with phentermine added in.  I think she is at a point where she can come off the medication completely.  Discontinue losartan.  Recommend checking blood pressure at least 2-3 times weekly (at work is okay).  Reviewed appropriate method for checking blood pressure to get accurate results.  Goal of 130/80 or less.  If consistently running higher, we will need to restart medication.  Low-sodium diet and lifestyle modifications recommended.

## 2021-10-15 NOTE — Assessment & Plan Note (Signed)
Discussed dietary modifications in detail.  She is only eating 1 meal a day so is likely not getting enough calories to support her exercise habits and attempted weight loss.  Recommend at least a 1500-calorie diet.  Advised on recommendations for high-protein and various options to get this in on a daily basis.  Examples for macronutrient goals provided with AVS.  Continue regular intentional exercise 3 to 4 days weekly.

## 2021-10-15 NOTE — Assessment & Plan Note (Signed)
Referring to GI for colonoscopy.

## 2021-10-15 NOTE — Patient Instructions (Signed)
1500 calorie diet example  100g protein daily 58g fat daily 145g carbs daily

## 2021-10-17 ENCOUNTER — Other Ambulatory Visit: Payer: Self-pay | Admitting: Medical-Surgical

## 2021-10-29 ENCOUNTER — Encounter: Payer: Self-pay | Admitting: Gastroenterology

## 2021-11-04 ENCOUNTER — Encounter: Payer: Self-pay | Admitting: Obstetrics and Gynecology

## 2021-11-04 ENCOUNTER — Ambulatory Visit (INDEPENDENT_AMBULATORY_CARE_PROVIDER_SITE_OTHER): Payer: Commercial Managed Care - HMO

## 2021-11-04 ENCOUNTER — Other Ambulatory Visit: Payer: Self-pay

## 2021-11-04 ENCOUNTER — Ambulatory Visit (INDEPENDENT_AMBULATORY_CARE_PROVIDER_SITE_OTHER): Payer: Commercial Managed Care - HMO | Admitting: Obstetrics and Gynecology

## 2021-11-04 ENCOUNTER — Ambulatory Visit (AMBULATORY_SURGERY_CENTER): Payer: Self-pay | Admitting: *Deleted

## 2021-11-04 VITALS — Ht 62.0 in | Wt 234.0 lb

## 2021-11-04 VITALS — BP 129/84 | HR 60 | Ht 62.0 in | Wt 234.0 lb

## 2021-11-04 DIAGNOSIS — M62838 Other muscle spasm: Secondary | ICD-10-CM

## 2021-11-04 DIAGNOSIS — K5904 Chronic idiopathic constipation: Secondary | ICD-10-CM | POA: Diagnosis not present

## 2021-11-04 DIAGNOSIS — R350 Frequency of micturition: Secondary | ICD-10-CM

## 2021-11-04 DIAGNOSIS — Z1211 Encounter for screening for malignant neoplasm of colon: Secondary | ICD-10-CM

## 2021-11-04 DIAGNOSIS — Z1231 Encounter for screening mammogram for malignant neoplasm of breast: Secondary | ICD-10-CM

## 2021-11-04 LAB — POCT URINALYSIS DIPSTICK
Bilirubin, UA: NEGATIVE
Blood, UA: NEGATIVE
Glucose, UA: NEGATIVE
Ketones, UA: NEGATIVE
Leukocytes, UA: NEGATIVE
Nitrite, UA: NEGATIVE
Protein, UA: NEGATIVE
Spec Grav, UA: 1.02 (ref 1.010–1.025)
Urobilinogen, UA: 2 E.U./dL — AB
pH, UA: 7 (ref 5.0–8.0)

## 2021-11-04 MED ORDER — PLENVU 140 G PO SOLR: 1.0000 | Freq: Once | ORAL | 0 refills | Status: AC

## 2021-11-04 NOTE — Progress Notes (Signed)
Pre visit conducted in person. No egg or soy allergy known to patient  No issues known to pt with past sedation with any surgeries or procedures  Patient denies ever being told they had issues or difficulty with intubation   .  Patient had very large bun on back of head during hysterectomy and was told due to her hair they had difficulty full extending her neck.  Patient plans to wear her hair down that day.   No FH of Malignant Hyperthermia Pt is not on diet pills Pt is not on  home 02  Pt is not on blood thinners  Pt denies issues with constipation  No A fib or A flutter  Discussed with pt there will be an out-of-pocket cost for prep and that varies from $0 to 70 +  dollars - pt verbalized understanding  Pt instructed to use Singlecare.com or GoodRx for a price reduction on prep   PV completed over the phone. Pt verified name, DOB, address and insurance during PV today.  Pt mailed instruction packet with copy of consent form to read and not return, and instructions.  Pt encouraged to call with questions or issues.  If pt has My chart, procedure instructions sent via My Chart  Insurance confirmed with pt at [facility] today
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
## 2021-11-04 NOTE — Patient Instructions (Signed)

## 2021-11-11 ENCOUNTER — Ambulatory Visit (INDEPENDENT_AMBULATORY_CARE_PROVIDER_SITE_OTHER): Payer: Commercial Managed Care - HMO | Admitting: Medical-Surgical

## 2021-11-11 ENCOUNTER — Encounter: Payer: Self-pay | Admitting: Medical-Surgical

## 2021-11-11 ENCOUNTER — Other Ambulatory Visit: Payer: Self-pay | Admitting: Medical-Surgical

## 2021-11-11 MED ORDER — PHENTERMINE HCL 37.5 MG PO TABS: ORAL_TABLET | ORAL | 0 refills | Status: DC

## 2021-11-11 NOTE — Progress Notes (Signed)
   Subjective:    Patient ID: Linda Porter, female    DOB: 06/09/1975, 46 y.o.   MRN: [id number]  HPI Patient is here for a weight and blood pressure check. Denies trouble sleeping, palpitations, or medication problems. Patient reports that she is using portion control and exercising in addition to the medication and is frustrated at the lack of progress. She also reported episodes of nausea and lightheadedness that she was not sure were from not eating or her BP. She will think about adding Topamax as she already has no appetite; reporting only eating one meal a day.    Review of Systems     Objective:   Physical Exam        Assessment & Plan:  Advised patient to eat several small snack/meals throughout the day to make sure to help decrease the episodes of nausea and lightheadedness. Patient has lost some weight. A refill of phentermine was queued up for provider to send to pharmacy. Patient has an appt scheduled with [name] on 12/02/21.

## 2021-11-11 NOTE — Progress Notes (Signed)
Agree with documentation as below. Medication sent. ___________________________________________ Clearnce Sorrel, DNP, APRN, FNP-BC Primary Care and [place]

## 2021-11-22 ENCOUNTER — Emergency Department (INDEPENDENT_AMBULATORY_CARE_PROVIDER_SITE_OTHER)
Admission: EM | Admit: 2021-11-22 | Discharge: 2021-11-22 | Disposition: A | Discharge status: Home / Self Care | Payer: Commercial Managed Care - HMO | Source: Home / Self Care

## 2021-11-22 DIAGNOSIS — H6691 Otitis media, unspecified, right ear: Secondary | ICD-10-CM | POA: Diagnosis not present

## 2021-11-22 DIAGNOSIS — H6123 Impacted cerumen, bilateral: Secondary | ICD-10-CM

## 2021-11-22 MED ORDER — AMOXICILLIN-POT CLAVULANATE 875-125 MG PO TABS: 1.0000 | ORAL_TABLET | Freq: Two times a day (BID) | ORAL | 0 refills | Status: AC

## 2021-11-22 MED ORDER — FLUCONAZOLE 200 MG PO TABS: ORAL_TABLET | ORAL | 0 refills | Status: DC

## 2021-11-22 NOTE — ED Provider Notes (Signed)
[name]    CSN: [id number] Arrival date & time: 11/22/21  [place]      History   Chief Complaint Chief Complaint  Patient presents with   Otalgia   Ear Drainage    HPI Linda Porter is a 46 y.o. female.   HPI 45 year old female presents with bilateral otalgia and ear drainage for 1 month.  PMH significant for morbid obesity, hypertension, and neuropathy  Past Medical History:  Diagnosis Date   Allergy    Anemia    Anxiety    Arthritis    Asthma    Eczema    Flat feet    GERD (gastroesophageal reflux disease)    Hypertension    Leg cramps    Lumbago    Neuropathy    Seizure ([place]) 06/29/2009   saw neuro (felt to be related to a med she was taking)   Vaginal Pap smear, abnormal     Patient Active Problem List   Diagnosis Date Noted   Colon cancer screening 10/15/2021   H/O: hysterectomy 08/27/2021   Essential hypertension 08/14/2019   Insomnia 04/08/2015   Spells 04/08/2015   Convulsions ([place]) 03/09/2015   Vitamin D deficiency 08/31/2011   GERD (gastroesophageal reflux disease) 08/31/2011   Easy bruising 08/31/2011   Acquired pes planus of both feet 08/31/2011   Obesity, morbid, BMI 40.0-49.9 ([place]) 08/28/2007   ASTHMA 06/18/2007   Asthma 06/18/2007   NEUROPATHY 05/09/2007   ECZEMA 05/09/2007   Lumbago 05/09/2007    Past Surgical History:  Procedure Laterality Date   BREAST BIOPSY     left breast at [facility] 2019-benign per pt   CESAREAN SECTION     x 1   LAPAROSCOPIC TOTAL HYSTERECTOMY     bilateral salpingectomy   SPINE SURGERY  June 2022   TOTAL ABDOMINAL HYSTERECTOMY  June 2022    OB History     Gravida  2   Para  1   Term      Preterm      AB  1   Living  1      SAB  1   IAB      Ectopic      Multiple      Live Births  1            Home Medications    Prior to Admission medications   Medication Sig Start Date End Date Taking? Authorizing Provider  amoxicillin-clavulanate (AUGMENTIN)  875-125 MG tablet Take 1 tablet by mouth 2 (two) times daily for 10 days. 11/22/21 12/02/21 Yes Eliezer Lofts, FNP  fluconazole (DIFLUCAN) 200 MG tablet Take 1 tab p.o. now, may repeat 1 tab p.o. in 3 days if symptoms are not resolved. 11/22/21  Yes Eliezer Lofts, FNP  albuterol (PROAIR HFA) 108 (90 Base) MCG/ACT inhaler Inhale 1-2 puffs into the lungs every 6 (six) hours as needed for wheezing or shortness of breath. 08/26/21   Samuel Bouche, NP  ibuprofen (ADVIL,MOTRIN) 200 MG tablet Take 200 mg by mouth. Takes as needed for pain Patient not taking: Reported on 11/04/2021    [provider]  phentermine (ADIPEX-P) 37.5 MG tablet One tab by mouth qAM 11/11/21   Samuel Bouche, NP  sertraline (ZOLOFT) 100 MG tablet Take 1 tablet (100 mg total) by mouth daily. Patient not taking: Reported on 11/04/2021 08/26/21   Samuel Bouche, NP  traZODone (DESYREL) 50 MG tablet Take 0.5-1 tablets (25-50 mg total) by mouth at bedtime as needed for sleep.  08/26/21   Samuel Bouche, NP    Family History Family History  Problem Relation Age of Onset   Asthma Mother    Cancer Mother        multiple myeloma   Kidney disease Mother        related to chemo for treating multiple myeloma   Hypertension Father    Lupus Sister        affecting skin only   Asthma Sister    Arthritis Sister        related to MVA   Colon cancer Paternal Grandmother    Diabetes Neg Hx    Heart disease Neg Hx    Esophageal cancer Neg Hx    Rectal cancer Neg Hx    Stomach cancer Neg Hx     Social History Social History   Tobacco Use   Smoking status: Never   Smokeless tobacco: Never  Vaping Use   Vaping Use: Never used  Substance Use Topics   Alcohol use: Yes    Comment: Occ   Drug use: No     Allergies   Iodine, Other, and Nerve block tray   Review of Systems Review of Systems   Physical Exam Triage Vital Signs ED Triage Vitals  Enc Vitals Group     BP 11/22/21 1851 137/85     Pulse Rate 11/22/21 1851 65      Resp 11/22/21 1851 20     Temp 11/22/21 1851 98.9 F (37.2 C)     Temp Source 11/22/21 1851 Oral     SpO2 11/22/21 1851 100 %     Weight 11/22/21 1848 231 lb (104.8 kg)     Height 11/22/21 1848 $RemoveBefor'5\' 2"'KaFgldlhDMmt$  (1.575 m)     Head Circumference --      Peak Flow --      Pain Score 11/22/21 1847 8     Pain Loc --      Pain Edu? --      Excl. in [place]? --    No data found.  Updated Vital Signs BP 137/85 (BP Location: Left Arm)   Pulse 65   Temp 98.9 F (37.2 C) (Oral)   Resp 20   Ht $R'5\' 2"'Hw$  (1.575 m)   Wt 231 lb (104.8 kg)   LMP 11/07/2019   SpO2 100%   BMI 42.25 kg/m    Physical Exam Vitals and nursing note reviewed.  Constitutional:      General: She is not in acute distress.    Appearance: She is obese. She is not ill-appearing.  HENT:     Head: Normocephalic and atraumatic.     Right Ear: External ear normal.     Left Ear: External ear normal.     Ears:     Comments: Bilateral EACs occluded with excessive cerumen unable to visualize either TM; post bilateral ear lavage: left EAC-clear, left TM-clear, retracted with trace serous effusions noted; right EAC-clear, right TM-red rimmed, retracted    Mouth/Throat:     Mouth: Mucous membranes are moist.     Pharynx: Oropharynx is clear.  Eyes:     Extraocular Movements: Extraocular movements intact.     Conjunctiva/sclera: Conjunctivae normal.     Pupils: Pupils are equal, round, and reactive to light.  Cardiovascular:     Rate and Rhythm: Normal rate and regular rhythm.     Pulses: Normal pulses.     Heart sounds: Normal heart sounds. No murmur heard. Pulmonary:     Effort: Pulmonary  effort is normal.     Breath sounds: Normal breath sounds. No wheezing, rhonchi or rales.  Musculoskeletal:     Cervical back: Normal range of motion and neck supple.  Skin:    General: Skin is warm and dry.  Neurological:     General: No focal deficit present.     Mental Status: She is alert and oriented to person, place, and time. Mental status  is at baseline.      UC Treatments / Results  Labs (all labs ordered are listed, but only abnormal results are displayed) Labs Reviewed - No data to display  EKG   Radiology No results found.  Procedures Procedures (including critical care time)  Medications Ordered in UC Medications - No data to display  Initial Impression / Assessment and Plan / UC Course  I have reviewed the triage vital signs and the nursing notes.  Pertinent labs & imaging results that were available during my care of the patient were reviewed by me and considered in my medical decision making (see chart for details).     MDM: 1.  Right acute otitis media-Rx'd Augmentin, Diflucan. Instructed patient to take medication as directed with food to completion.  Encouraged patient to increase daily water intake while taking these medications. 2.  Advised patient if symptoms worsen and/or unresolved please follow-up with PCP or here for further evaluation.  Patient discharged home, hemodynamically stable. Final Clinical Impressions(s) / UC Diagnoses   Final diagnoses:  Right acute otitis media  Excessive cerumen in both ear canals     Discharge Instructions      Instructed patient to take medication as directed with food to completion.  Encouraged patient to increase daily water intake while taking these medications.  Advised patient if symptoms worsen and/or unresolved please follow-up with PCP or here for further evaluation.     ED Prescriptions     Medication Sig Dispense Auth. Provider   amoxicillin-clavulanate (AUGMENTIN) 875-125 MG tablet Take 1 tablet by mouth 2 (two) times daily for 10 days. 20 tablet Eliezer Lofts, FNP   fluconazole (DIFLUCAN) 200 MG tablet Take 1 tab p.o. now, may repeat 1 tab p.o. in 3 days if symptoms are not resolved. 5 tablet Eliezer Lofts, FNP      PDMP not reviewed this encounter.   Eliezer Lofts, [place] 11/22/21 1945

## 2021-11-22 NOTE — Discharge Instructions (Addendum)
Instructed patient to take medication as directed with food to completion.  Encouraged patient to increase daily water intake while taking these medications.  Advised patient if symptoms worsen and/or unresolved please follow-up with PCP or here for further evaluation.

## 2021-11-22 NOTE — ED Triage Notes (Signed)
Pt presents to Urgent Care with c/o worsening bilateral otalgia and ear drainage x approx one month.

## 2021-11-23 ENCOUNTER — Telehealth: Payer: Self-pay | Admitting: Gastroenterology

## 2021-11-23 NOTE — Telephone Encounter (Signed)
Pt informed ok to continue med as prescribed , no issues - pt verbalized understanding

## 2021-11-23 NOTE — Telephone Encounter (Signed)
Patient has a colonoscopy next week with Dr. Lyndel Safe and has been prescribed amoxicillin and fluconavole and wants to know if it's OK for her to take it.  Please call patient and advise.  Thank you.

## 2021-11-30 ENCOUNTER — Encounter: Payer: Self-pay | Admitting: Gastroenterology

## 2021-12-01 NOTE — Progress Notes (Signed)
Established Patient Office Visit  Subjective   Patient ID: Linda Porter, female   DOB: [birth year] Age: 46 y.o. MRN: [id number]   Chief Complaint  Patient presents with   Hypertension   Follow-up    HPI Pleasant 46 year old female presenting today for follow up on:  HTN: now completely off medications. Checking at home with readings in the 120s/70s but it has been fluctuating some. Notes she has been under a lot more stress lately and thinks it might be causing the issues with BP. Plans to go to the [place] in a couple of weeks.   Insomnia: Taking trazodone 50 mg nightly, tolerating well without side effects.  Feels the medication works well for her and is happy with this current dose.  Had a recent ear infection that she was seen for urgent care.  She has taken Augmentin as prescribed and finishes this up in the next few days.  Notes that her ears are still uncomfortable, left more than right.  Weight loss: Has been using phentermine, tolerating well without side effects.  She did do 2 months of the lower dose but has completed 2 months of the higher dose of phentermine.  She likes the medication and feels that it is working well for her.  She has been off phentermine for the last couple of weeks due to an upcoming colonoscopy and notes that she has continued to lose weight which is reassuring.  Would like to complete 1 more month of phentermine.   Objective:    Vitals:   12/02/21 0900 12/02/21 0923  BP: 135/84 123/78  Pulse: 67 (!) 57  Resp: 20 20  Height: '5\' 2"'$  (1.575 m)   Weight: 224 lb 6.4 oz (101.8 kg)   SpO2: 99% 98%  BMI (Calculated): 41.03     Physical Exam Vitals and nursing note reviewed.  Constitutional:      General: She is not in acute distress.    Appearance: Normal appearance. She is not ill-appearing.  HENT:     Head: Normocephalic and atraumatic.     Right Ear: Hearing normal.     Left Ear: Hearing normal.     Ears:     Comments: Lateral ear canals  erythematous with small amount of pale yellow exudate. Cardiovascular:     Rate and Rhythm: Normal rate and regular rhythm.     Pulses: Normal pulses.     Heart sounds: Normal heart sounds.  Pulmonary:     Effort: Pulmonary effort is normal. No respiratory distress.     Breath sounds: Normal breath sounds. No wheezing, rhonchi or rales.  Skin:    General: Skin is warm and dry.  Neurological:     Mental Status: She is alert and oriented to person, place, and time.  Psychiatric:        Mood and Affect: Mood normal.        Behavior: Behavior normal.        Thought Content: Thought content normal.        Judgment: Judgment normal.   No results found for this or any previous visit (from the past 24 hour(s)).     The 10-year ASCVD risk score (Arnett DK, et al., 2019) is: 2.1%   Values used to calculate the score:     Age: 83 years     Sex: Female     Is Non-Hispanic African American: Yes     Diabetic: No     Tobacco smoker: No  Systolic Blood Pressure: 591 mmHg     Is BP treated: Yes     HDL Cholesterol: 60 mg/dL     Total Cholesterol: 195 mg/dL   Assessment & Plan:   1. Essential hypertension Home readings for blood pressure have been fabulous since she stopped her medication.  She has had a couple of days where it was borderline in the 130s/80s but otherwise has been at goal.  Blood pressure slightly elevated on arrival today but on recheck looked great.  Since she is having concerns about the days when her blood pressure goes up and [place], providing prescription for hydrochlorothiazide 12.5 mg daily as needed to use at her discretion.  Continue to monitor blood pressures at home with goal of 130/80 or less.  If consistently higher, return for further evaluation.  Continue low-sodium diet.  Work on regular intentional exercise and continue weight loss efforts.  2. Acute otitis externa of both ears, unspecified type Complete Augmentin as prescribed.   Evidence of otitis externa bilaterally so sending in Ciprodex 4 drops to each ear twice daily for 7 days.  3. Primary insomnia Continue trazodone 50 mg nightly at bedtime.  4.  Morbid obesity Reviewed PDMP.  Okay to continue phentermine 37.5 mg daily for 1 more month then we will need to come off for a break.  Continue regular intentional exercise and dietary modifications with a goal for weight loss to healthy weight.  Return in about 3 months (around 03/04/2022) for HTN follow up.  ___________________________________________ Clearnce Sorrel, DNP, APRN, FNP-BC [facility] [place]

## 2021-12-02 ENCOUNTER — Encounter: Payer: Self-pay | Admitting: Medical-Surgical

## 2021-12-02 ENCOUNTER — Ambulatory Visit (INDEPENDENT_AMBULATORY_CARE_PROVIDER_SITE_OTHER): Payer: Commercial Managed Care - HMO | Admitting: Medical-Surgical

## 2021-12-02 VITALS — BP 123/78 | HR 57 | Resp 20 | Ht 62.0 in | Wt 224.4 lb

## 2021-12-02 DIAGNOSIS — F5101 Primary insomnia: Secondary | ICD-10-CM | POA: Diagnosis not present

## 2021-12-02 DIAGNOSIS — I10 Essential (primary) hypertension: Secondary | ICD-10-CM

## 2021-12-02 DIAGNOSIS — H60503 Unspecified acute noninfective otitis externa, bilateral: Secondary | ICD-10-CM

## 2021-12-02 MED ORDER — HYDROCHLOROTHIAZIDE 12.5 MG PO TABS: 12.5000 mg | ORAL_TABLET | Freq: Every day | ORAL | 3 refills | Status: DC | PRN

## 2021-12-02 MED ORDER — CIPROFLOXACIN-DEXAMETHASONE 0.3-0.1 % OT SUSP: 4.0000 [drp] | Freq: Two times a day (BID) | OTIC | 0 refills | Status: DC

## 2021-12-02 MED ORDER — TRAZODONE HCL 50 MG PO TABS: 50.0000 mg | ORAL_TABLET | Freq: Every evening | ORAL | 3 refills | Status: AC | PRN

## 2021-12-02 MED ORDER — PHENTERMINE HCL 37.5 MG PO TABS: ORAL_TABLET | ORAL | 0 refills | Status: DC

## 2021-12-02 MED ORDER — NEOMYCIN-POLYMYXIN-HC 1 % OT SOLN: 3.0000 [drp] | Freq: Four times a day (QID) | OTIC | 0 refills | Status: AC

## 2021-12-03 ENCOUNTER — Ambulatory Visit (AMBULATORY_SURGERY_CENTER): Payer: Commercial Managed Care - HMO | Admitting: Gastroenterology

## 2021-12-03 ENCOUNTER — Encounter: Payer: Self-pay | Admitting: Gastroenterology

## 2021-12-03 VITALS — BP 116/74 | HR 75 | Temp 97.3°F | Resp 13 | Ht 62.0 in | Wt 234.0 lb

## 2021-12-03 DIAGNOSIS — Z1211 Encounter for screening for malignant neoplasm of colon: Secondary | ICD-10-CM

## 2021-12-03 DIAGNOSIS — K621 Rectal polyp: Secondary | ICD-10-CM | POA: Diagnosis not present

## 2021-12-03 DIAGNOSIS — D128 Benign neoplasm of rectum: Secondary | ICD-10-CM

## 2021-12-03 MED ORDER — SODIUM CHLORIDE 0.9 % IV SOLN: 500.0000 mL | Freq: Once | INTRAVENOUS | Status: DC

## 2021-12-03 NOTE — Op Note (Signed)
[facility] Patient Name: Linda Porter Procedure Date: 12/03/2021 2:42 PM MRN: [id number] Endoscopist: Jackquline Denmark , MD Age: 46 Referring MD:  Date of Birth: Aug 22, 1975 Gender: Female Account #: [id number] Procedure:                Colonoscopy Indications:              Screening for colorectal malignant neoplasm Medicines:                Monitored Anesthesia Care Procedure:                Pre-Anesthesia Assessment:                           - Prior to the procedure, a History and Physical                            was performed, and patient medications and                            allergies were reviewed. The patient's tolerance of                            previous anesthesia was also reviewed. The risks                            and benefits of the procedure and the sedation                            options and risks were discussed with the patient.                            All questions were answered, and informed consent                            was obtained. Prior Anticoagulants: The patient has                            taken no previous anticoagulant or antiplatelet                            agents. ASA Grade Assessment: II - A patient with                            mild systemic disease. After reviewing the risks                            and benefits, the patient was deemed in                            satisfactory condition to undergo the procedure.                           After obtaining informed consent, the colonoscope  was passed under direct vision. Throughout the                            procedure, the patient's blood pressure, pulse, and                            oxygen saturations were monitored continuously. The                            Olympus PCF-H190DL (#[id number]) Colonoscope was                            introduced through the anus and advanced to the 2                            cm into the ileum. The  colonoscopy was performed                            without difficulty. The patient tolerated the                            procedure well. The quality of the bowel                            preparation was good. The terminal ileum, ileocecal                            valve, appendiceal orifice, and rectum were                            photographed. Scope In: 2:48:56 PM Scope Out: 3:05:27 PM Scope Withdrawal Time: 0 hours 12 minutes 6 seconds  Total Procedure Duration: 0 hours 16 minutes 31 seconds  Findings:                 A 4 mm polyp was found in the distal rectum. The                            polyp was sessile. The polyp was removed with a                            cold biopsy forceps. Resection and retrieval were                            complete.                           A few rare (2-3) small-mouthed diverticula were                            found in the sigmoid colon.                           Non-bleeding internal hemorrhoids were found during  retroflexion. The hemorrhoids were small and Grade                            I (internal hemorrhoids that do not prolapse).                           The terminal ileum appeared normal.                           The exam was otherwise without abnormality on                            direct and retroflexion views. Complications:            No immediate complications. Estimated Blood Loss:     Estimated blood loss: none. Impression:               - One 4 mm polyp in the distal rectum, removed with                            a cold biopsy forceps. Resected and retrieved.                           - Minimal sigmoid diverticulosis.                           - Non-bleeding internal hemorrhoids.                           - The examined portion of the ileum was normal.                           - The examination was otherwise normal on direct                            and retroflexion  views. Recommendation:           - Patient has a contact number available for                            emergencies. The signs and symptoms of potential                            delayed complications were discussed with the                            patient. Return to normal activities tomorrow.                            Written discharge instructions were provided to the                            patient.                           - High fiber diet.                           -  Continue present medications.                           - Await pathology results.                           - Repeat colonoscopy for surveillance based on                            pathology results.                           - The findings and recommendations were discussed                            with the patient's family. Jackquline Denmark, MD 12/03/2021 3:09:27 PM This report has been signed electronically.

## 2021-12-03 NOTE — Progress Notes (Signed)
[facility] History and Physical   Primary Care Physician:  Samuel Bouche, NP   Reason for Procedure:   Colorectal cancer screening  Plan:     colonoscopy     HPI: Linda Porter is a 46 y.o. female   No nausea, vomiting, heartburn, regurgitation, odynophagia or dysphagia.  No significant diarrhea or constipation.  No melena or hematochezia. No unintentional weight loss. No abdominal pain.  Past Medical History:  Diagnosis Date   Allergy    Anemia    Anxiety    Arthritis    Asthma    Eczema    Flat feet    GERD (gastroesophageal reflux disease)    Hypertension    Leg cramps    Lumbago    Neuropathy    Seizure ([place]) 06/29/2009   saw neuro (felt to be related to a med she was taking)   Vaginal Pap smear, abnormal     Past Surgical History:  Procedure Laterality Date   BREAST BIOPSY     left breast at [facility] 2019-benign per pt   CESAREAN SECTION     x 1   LAPAROSCOPIC TOTAL HYSTERECTOMY     bilateral salpingectomy   SPINE SURGERY  June 2022   TOTAL ABDOMINAL HYSTERECTOMY  June 2022    Prior to Admission medications   Medication Sig Start Date End Date Taking? Authorizing Provider  fluconazole (DIFLUCAN) 200 MG tablet Take 1 tab p.o. now, may repeat 1 tab p.o. in 3 days if symptoms are not resolved. 11/22/21  Yes Eliezer Lofts, FNP  albuterol (PROAIR HFA) 108 (90 Base) MCG/ACT inhaler Inhale 1-2 puffs into the lungs every 6 (six) hours as needed for wheezing or shortness of breath. 08/26/21   Samuel Bouche, NP  hydrochlorothiazide (HYDRODIURIL) 12.5 MG tablet Take 1 tablet (12.5 mg total) by mouth daily as needed. 12/02/21   Samuel Bouche, NP  NEOMYCIN-POLYMYXIN-HYDROCORTISONE (CORTISPORIN) 1 % SOLN OTIC solution Place 3 drops into both ears 4 (four) times daily for 7 days. 12/02/21 12/09/21  Samuel Bouche, NP  phentermine (ADIPEX-P) 37.5 MG tablet One tab by mouth qAM 12/02/21   Samuel Bouche, NP  traZODone (DESYREL) 50 MG tablet Take 1 tablet (50 mg  total) by mouth at bedtime as needed for sleep. 12/02/21   Samuel Bouche, NP    Current Outpatient Medications  Medication Sig Dispense Refill   fluconazole (DIFLUCAN) 200 MG tablet Take 1 tab p.o. now, may repeat 1 tab p.o. in 3 days if symptoms are not resolved. 5 tablet 0   albuterol (PROAIR HFA) 108 (90 Base) MCG/ACT inhaler Inhale 1-2 puffs into the lungs every 6 (six) hours as needed for wheezing or shortness of breath. 18 g 5   hydrochlorothiazide (HYDRODIURIL) 12.5 MG tablet Take 1 tablet (12.5 mg total) by mouth daily as needed. 30 tablet 3   NEOMYCIN-POLYMYXIN-HYDROCORTISONE (CORTISPORIN) 1 % SOLN OTIC solution Place 3 drops into both ears 4 (four) times daily for 7 days. 10 mL 0   phentermine (ADIPEX-P) 37.5 MG tablet One tab by mouth qAM 30 tablet 0   traZODone (DESYREL) 50 MG tablet Take 1 tablet (50 mg total) by mouth at bedtime as needed for sleep. 90 tablet 3   Current Facility-Administered Medications  Medication Dose Route Frequency Provider Last Rate Last Admin   0.9 %  sodium chloride infusion  500 mL Intravenous Once Jackquline Denmark, MD        Allergies as of 12/03/2021 - Review Complete 12/03/2021  Allergen Reaction Noted  Iodine Swelling 11/30/2020   Other Other (See Comments) 09/21/2020   Nerve block tray Swelling 08/31/2011    Family History  Problem Relation Age of Onset   Asthma Mother    Cancer Mother        multiple myeloma   Kidney disease Mother        related to chemo for treating multiple myeloma   Hypertension Father    Lupus Sister        affecting skin only   Asthma Sister    Arthritis Sister        related to MVA   Colon cancer Paternal Grandmother    Diabetes Neg Hx    Heart disease Neg Hx    Esophageal cancer Neg Hx    Rectal cancer Neg Hx    Stomach cancer Neg Hx     Social History   Socioeconomic History   Marital status: Single    Spouse name: Not on file   Number of children: 1   Years of education: Not on file   Highest  education level: Not on file  Occupational History   Occupation: nanny; Microbiologist at [employer]    Employer: [employer]  Tobacco Use   Smoking status: Never   Smokeless tobacco: Never  Vaping Use   Vaping Use: Never used  Substance and Sexual Activity   Alcohol use: Yes    Comment: Occ   Drug use: No   Sexual activity: Not on file  Other Topics Concern   Not on file  Social History Narrative   Lives with daughter, dog   Social Determinants of Health   Financial Resource Strain: Not on file  Food Insecurity: Not on file  Transportation Needs: Not on file  Physical Activity: Not on file  Stress: Not on file  Social Connections: Not on file  Intimate Partner Violence: Not on file    Review of Systems: Positive for none All other review of systems negative except as mentioned in the HPI.  Physical Exam: Vital signs in last 24 hours: $RemoveB'@VSRANGES'dHohjVte$ @   General:   Alert,  Well-developed, well-nourished, pleasant and cooperative in NAD Lungs:  Clear throughout to auscultation.   Heart:  Regular rate and rhythm; no murmurs, clicks, rubs,  or gallops. Abdomen:  Soft, nontender and nondistended. Normal bowel sounds.   Neuro/Psych:  Alert and cooperative. Normal mood and affect. A and O x 3    No significant changes were identified.  The patient continues to be an appropriate candidate for the planned procedure and anesthesia.   Carmell Austria, MD. [facility] 12/03/2021 2:40 PM@

## 2021-12-03 NOTE — Progress Notes (Signed)
Pt's states no medical or surgical changes since previsit or office visit. 

## 2021-12-03 NOTE — Progress Notes (Signed)
Called to room to assist during endoscopic procedure.  Patient ID and intended procedure confirmed with present staff. Received instructions for my participation in the procedure from the performing physician.  

## 2021-12-03 NOTE — Progress Notes (Signed)
PT taken to PACU. Monitors in place. VSS. Report given to RN. 

## 2021-12-03 NOTE — Patient Instructions (Signed)
Handout on high fiber diet and polyps given to patient.  Await pathology results. Resume previous diet and continue present medications. Repeat colonoscopy for surveillance will be determined based off of pathology results.  YOU HAD AN ENDOSCOPIC PROCEDURE TODAY AT [facility]:   Refer to the procedure report that was given to you for any specific questions about what was found during the examination.  If the procedure report does not answer your questions, please call your gastroenterologist to clarify.  If you requested that your care partner not be given the details of your procedure findings, then the procedure report has been included in a sealed envelope for you to review at your convenience later.  YOU SHOULD EXPECT: Some feelings of bloating in the abdomen. Passage of more gas than usual.  Walking can help get rid of the air that was put into your GI tract during the procedure and reduce the bloating. If you had a lower endoscopy (such as a colonoscopy or flexible sigmoidoscopy) you may notice spotting of blood in your stool or on the toilet paper. If you underwent a bowel prep for your procedure, you may not have a normal bowel movement for a few days.  Please Note:  You might notice some irritation and congestion in your nose or some drainage.  This is from the oxygen used during your procedure.  There is no need for concern and it should clear up in a day or so.  SYMPTOMS TO REPORT IMMEDIATELY:  Following lower endoscopy (colonoscopy or flexible sigmoidoscopy):  Excessive amounts of blood in the stool  Significant tenderness or worsening of abdominal pains  Swelling of the abdomen that is new, acute  Fever of 100F or higher   For urgent or emergent issues, a gastroenterologist can be reached at any hour by calling [phone]. Do not use MyChart messaging for urgent concerns.    DIET:  We do recommend a small meal at first, but then you may proceed to your  regular diet.  Drink plenty of fluids but you should avoid alcoholic beverages for 24 hours.  ACTIVITY:  You should plan to take it easy for the rest of today and you should NOT DRIVE or use heavy machinery until tomorrow (because of the sedation medicines used during the test).    FOLLOW UP: Our staff will call the number listed on your records the next business day following your procedure.  We will call around 7:15- 8:00 am to check on you and address any questions or concerns that you may have regarding the information given to you following your procedure. If we do not reach you, we will leave a message.  If you develop any symptoms (ie: fever, flu-like symptoms, shortness of breath, cough etc.) before then, please call [phone].  If you test positive for Covid 19 in the 2 weeks post procedure, please call and report this information to [place].    If any biopsies were taken you will be contacted by phone or by letter within the next 1-3 weeks.  Please call us at [phone] if you have not heard about the biopsies in 3 weeks.    SIGNATURES/CONFIDENTIALITY: You and/or your care partner have signed paperwork which will be entered into your electronic medical record.  These signatures attest to the fact that that the information above on your After Visit Summary has been reviewed and is understood.  Full responsibility of the confidentiality of this discharge information lies with you and/or your  care-partner.  

## 2021-12-06 ENCOUNTER — Telehealth: Payer: Self-pay

## 2021-12-06 NOTE — Telephone Encounter (Signed)
  Follow up Call-     12/03/2021    2:20 PM  Call back number  Post procedure Call Back phone  # [phone]  Permission to leave phone message Yes     Patient questions:  Do you have a fever, pain , or abdominal swelling? No. Pain Score  0 *  Have you tolerated food without any problems? Yes.    Have you been able to return to your normal activities? Yes.    Do you have any questions about your discharge instructions: Diet   No. Medications  No. Follow up visit  No.  Do you have questions or concerns about your Care? No.  Actions: * If pain score is 4 or above: No action needed, pain <4.

## 2021-12-09 ENCOUNTER — Encounter: Payer: Self-pay | Admitting: Gastroenterology

## 2021-12-23 ENCOUNTER — Encounter: Payer: Self-pay | Admitting: Medical-Surgical

## 2022-01-17 ENCOUNTER — Other Ambulatory Visit: Payer: Self-pay | Admitting: Medical-Surgical

## 2022-01-19 ENCOUNTER — Ambulatory Visit
Admission: EM | Admit: 2022-01-19 | Discharge: 2022-01-19 | Disposition: A | Discharge status: Home / Self Care | Payer: Commercial Managed Care - HMO | Attending: Family Medicine | Admitting: Family Medicine

## 2022-01-19 DIAGNOSIS — J4521 Mild intermittent asthma with (acute) exacerbation: Secondary | ICD-10-CM

## 2022-01-19 DIAGNOSIS — Z20822 Contact with and (suspected) exposure to covid-19: Secondary | ICD-10-CM | POA: Insufficient documentation

## 2022-01-19 DIAGNOSIS — R051 Acute cough: Secondary | ICD-10-CM

## 2022-01-19 LAB — SARS CORONAVIRUS 2 BY RT PCR: SARS Coronavirus 2 by RT PCR: NEGATIVE

## 2022-01-19 MED ORDER — PREDNISONE 20 MG PO TABS: ORAL_TABLET | ORAL | 0 refills | Status: DC

## 2022-01-19 MED ORDER — METHYLPREDNISOLONE SODIUM SUCC 125 MG IJ SOLR
80.0000 mg | Freq: Once | INTRAMUSCULAR | Status: AC
  Administered 2022-01-19: 80 mg via INTRAMUSCULAR

## 2022-01-19 NOTE — ED Provider Notes (Signed)
[name]    CSN: [id number] Arrival date & time: 01/19/22  1056      History   Chief Complaint Chief Complaint  Patient presents with   Shortness of Breath   Cough    HPI Linda Porter is a 46 y.o. female.   Patient has asthma, complaining of 3 day history of frequent asthma attacks with shortness of breath requiring frequent use of her nebulizer/albuterol.  She denies sore throat, increased nasal congestion, fevers, chills, and sweats, pleuritic pain.  She has a history of seasonal rhinitis worse in the spring.  The history is provided by the patient.    Past Medical History:  Diagnosis Date   Allergy    Anemia    Anxiety    Arthritis    Asthma    Eczema    Flat feet    GERD (gastroesophageal reflux disease)    Hypertension    Leg cramps    Lumbago    Neuropathy    Seizure ([place]) 06/29/2009   saw neuro (felt to be related to a med she was taking)   Vaginal Pap smear, abnormal     Patient Active Problem List   Diagnosis Date Noted   Colon cancer screening 10/15/2021   H/O: hysterectomy 08/27/2021   Essential hypertension 08/14/2019   Insomnia 04/08/2015   Spells 04/08/2015   Convulsions ([place]) 03/09/2015   Vitamin D deficiency 08/31/2011   GERD (gastroesophageal reflux disease) 08/31/2011   Easy bruising 08/31/2011   Acquired pes planus of both feet 08/31/2011   Obesity, morbid, BMI 40.0-49.9 ([place]) 08/28/2007   ASTHMA 06/18/2007   Asthma 06/18/2007   NEUROPATHY 05/09/2007   ECZEMA 05/09/2007   Lumbago 05/09/2007    Past Surgical History:  Procedure Laterality Date   BREAST BIOPSY     left breast [facility] 2019-benign per pt   CESAREAN SECTION     x 1   LAPAROSCOPIC TOTAL HYSTERECTOMY     bilateral salpingectomy   SPINE SURGERY  June 2022   TOTAL ABDOMINAL HYSTERECTOMY  June 2022    OB History     Gravida  2   Para  1   Term      Preterm      AB  1   Living  1      SAB  1   IAB      Ectopic       Multiple      Live Births  1            Home Medications    Prior to Admission medications   Medication Sig Start Date End Date Taking? Authorizing Provider  albuterol (ACCUNEB) 0.63 MG/3ML nebulizer solution Take 1 ampule by nebulization every 6 (six) hours as needed for wheezing.   Yes [provider]  predniSONE (DELTASONE) 20 MG tablet Take one tab by mouth twice daily for 4 days, then one daily for 3 days. Take with food. 01/19/22  Yes Kandra Nicolas, MD  albuterol (PROAIR HFA) 108 (90 Base) MCG/ACT inhaler Inhale 1-2 puffs into the lungs every 6 (six) hours as needed for wheezing or shortness of breath. 08/26/21   Samuel Bouche, NP  fluconazole (DIFLUCAN) 200 MG tablet Take 1 tab p.o. now, may repeat 1 tab p.o. in 3 days if symptoms are not resolved. 11/22/21   Eliezer Lofts, FNP  hydrochlorothiazide (HYDRODIURIL) 12.5 MG tablet Take 1 tablet (12.5 mg total) by mouth daily as needed. 12/02/21   Jessup,  [name], NP  phentermine (ADIPEX-P) 37.5 MG tablet One tab by mouth qAM 12/02/21   Samuel Bouche, NP  traZODone (DESYREL) 50 MG tablet Take 1 tablet (50 mg total) by mouth at bedtime as needed for sleep. 12/02/21   Samuel Bouche, NP    Family History Family History  Problem Relation Age of Onset   Asthma Mother    Cancer Mother        multiple myeloma   Kidney disease Mother        related to chemo for treating multiple myeloma   Hypertension Father    Lupus Sister        affecting skin only   Asthma Sister    Arthritis Sister        related to MVA   Colon cancer Paternal Grandmother    Diabetes Neg Hx    Heart disease Neg Hx    Esophageal cancer Neg Hx    Rectal cancer Neg Hx    Stomach cancer Neg Hx     Social History Social History   Tobacco Use   Smoking status: Never   Smokeless tobacco: Never  Vaping Use   Vaping Use: Never used  Substance Use Topics   Alcohol use: Yes    Comment: Occ   Drug use: No     Allergies   Iodine, Other, and Nerve block  tray   Review of Systems Review of Systems No sore throat + cough No pleuritic pain ? wheezing No nasal congestion No post-nasal drainage No sinus pain/pressure No itchy/red eyes No earache No hemoptysis + SOB No fever/chills No nausea No vomiting No abdominal pain No diarrhea No urinary symptoms No skin rash No fatigue No myalgias No headache   Physical Exam Triage Vital Signs ED Triage Vitals  Enc Vitals Group     BP 01/19/22 1130 125/83     Pulse Rate 01/19/22 1130 64     Resp 01/19/22 1130 20     Temp 01/19/22 1130 98.3 F (36.8 C)     Temp Source 01/19/22 1130 Oral     SpO2 01/19/22 1130 97 %     Weight 01/19/22 1126 230 lb (104.3 kg)     Height 01/19/22 1126 5' 2" (1.575 m)     Head Circumference --      Peak Flow --      Pain Score 01/19/22 1124 0     Pain Loc --      Pain Edu? --      Excl. in [place]? --    No data found.  Updated Vital Signs BP 125/83   Pulse 64   Temp 98.3 F (36.8 C) (Oral)   Resp 20   Ht 5' 2" (1.575 m)   Wt 104.3 kg   LMP 11/07/2019   SpO2 97%   BMI 42.07 kg/m   Visual Acuity Right Eye Distance:   Left Eye Distance:   Bilateral Distance:    Right Eye Near:   Left Eye Near:    Bilateral Near:     Physical Exam Nursing notes and Vital Signs reviewed. Appearance:  Patient appears stated age, and in no acute distress Eyes:  Pupils are equal, round, and reactive to light and accomodation.  Extraocular movement is intact.  Conjunctivae are not inflamed  Ears:  Canals normal.  Tympanic membranes normal.  Nose:  Normal turbinates.  No sinus tenderness.  Neck:  Supple.  No adenopathy. Lungs:  Clear to auscultation.  Breath sounds are equal.  Moving air well. Heart:  Regular rate and rhythm without murmurs, rubs, or gallops.  Abdomen:  Nontender without masses or hepatosplenomegaly.  Bowel sounds are present.  No CVA or flank tenderness.  Extremities:  No edema.  Skin:  No rash present.   UC Treatments / Results   Labs (all labs ordered are listed, but only abnormal results are displayed) Labs Reviewed  SARS CORONAVIRUS 2 BY RT PCR    EKG   Radiology No results found.  Procedures Procedures (including critical care time)  Medications Ordered in UC Medications  methylPREDNISolone sodium succinate (SOLU-MEDROL) 125 mg/2 mL injection 80 mg (80 mg Intramuscular Given 01/19/22 1327)    Initial Impression / Assessment and Plan / UC Course  I have reviewed the triage vital signs and the nursing notes.  Pertinent labs & imaging results that were available during my care of the patient were reviewed by me and considered in my medical decision making (see chart for details).    Benign exam.  There is no evidence of bacterial infection today.  Treat symptomatically for now  As a precaution, witll check COVID29 PCR. Administered Solumedrol 65m IM, then begin prednisone burst/taper. Followup with Family Doctor if not improved in one week.   Final Clinical Impressions(s) / UC Diagnoses   Final diagnoses:  Acute cough  Mild intermittent asthma with acute exacerbation     Discharge Instructions      Begin prednisone Thursday, September 7. If cold-like symptoms develop, try the following: Take plain guaifenesin (12067mextended release tabs such as Mucinex) twice daily, with plenty of water, for cough and congestion. Get adequate rest.   May take Delsym Cough Suppressant ("12 Hour Cough Relief") at bedtime for nighttime cough.  Try warm salt water gargles for sore throat.  Stop all antihistamines for now, and other non-prescription cough/cold preparations. Continue inhaler and nebulizer as needed.  If your COVID-19 test is positive, isolate yourself for five days from today.  At the end of five days you may end isolation if your symptoms have cleared or improved, and you have not had a fever for 24 hours. At this time you should wear a mask for five more days when you are around others.    If symptoms become significantly worse during the night or over the weekend, proceed to the local emergency room.          ED Prescriptions     Medication Sig Dispense Auth. Provider   predniSONE (DELTASONE) 20 MG tablet Take one tab by mouth twice daily for 4 days, then one daily for 3 days. Take with food. 11 tablet BeKandra NicolasMD         BeKandra NicolasMD 01/22/22 [phone]

## 2022-01-19 NOTE — ED Triage Notes (Signed)
Pt presents to Urgent Care with c/o sob x 2 days and cough x 1 day. States she has asthma and has been doing breathing treatments w/o significant relief. Afebrile.

## 2022-01-19 NOTE — ED Notes (Signed)
Per Rubin Payor RN, Pulse and O2 sat are stable. Advised to let pt access know if sxs worsen.

## 2022-01-19 NOTE — Discharge Instructions (Signed)
Begin prednisone Thursday, September 7. If cold-like symptoms develop, try the following: Take plain guaifenesin ('1200mg'$  extended release tabs such as Mucinex) twice daily, with plenty of water, for cough and congestion. Get adequate rest.   May take Delsym Cough Suppressant ("12 Hour Cough Relief") at bedtime for nighttime cough.  Try warm salt water gargles for sore throat.  Stop all antihistamines for now, and other non-prescription cough/cold preparations. Continue inhaler and nebulizer as needed.  If your COVID-19 test is positive, isolate yourself for five days from today.  At the end of five days you may end isolation if your symptoms have cleared or improved, and you have not had a fever for 24 hours. At this time you should wear a mask for five more days when you are around others.   If symptoms become significantly worse during the night or over the weekend, proceed to the local emergency room.

## 2022-01-20 ENCOUNTER — Telehealth: Payer: Self-pay | Admitting: Emergency Medicine

## 2022-01-20 ENCOUNTER — Telehealth: Payer: Self-pay

## 2022-01-20 NOTE — Telephone Encounter (Signed)
TCT pt to follow up from recent visit. Pt states she received negative covid results and denies any additional needs at this time.

## 2022-02-04 ENCOUNTER — Ambulatory Visit: Payer: Commercial Managed Care - HMO | Admitting: Obstetrics and Gynecology

## 2022-02-04 ENCOUNTER — Encounter: Payer: Self-pay | Admitting: Obstetrics and Gynecology

## 2022-02-04 VITALS — BP 132/84 | HR 56

## 2022-02-04 DIAGNOSIS — K5904 Chronic idiopathic constipation: Secondary | ICD-10-CM

## 2022-02-04 DIAGNOSIS — M62838 Other muscle spasm: Secondary | ICD-10-CM

## 2022-02-04 MED ORDER — DIAZEPAM 5 MG PO TABS: ORAL_TABLET | ORAL | 1 refills | Status: AC

## 2022-02-04 NOTE — Patient Instructions (Addendum)
Constipation: Our goal is to achieve formed bowel movements daily or every-other-day.  You may need to try different combinations of the following options to find what works best for you - everybody's body works differently so feel free to adjust the dosages as needed.  Some options to help maintain bowel health include:  Dietary changes (more leafy greens, vegetables and fruits; less processed foods) Fiber supplementation (Benefiber, FiberCon, Metamucil or Psyllium). Start slow and increase gradually to full dose. Over-the-counter agents such as: stool softeners (Docusate or Colace) and/or laxatives (Miralax, milk of magnesia)  "Power Pudding" is a natural mixture that may help your constipation.  To make blend 1 cup applesauce, 1 cup wheat bran, and 3/4 cup prune juice, refrigerate and then take 1 tablespoon daily with a large glass of water as needed.  I will prescribe valium 5 mg pills to place vaginally up to 2 times a day for vaginal muscle spasms. Start at night and for the next several weeks to see if it improves your symptoms. Once you are improving you can taper off the medication and just use as needed. If the medication makes you drowsy then only use at bedtime and/or we can reduce the dose. Do not use gel to place it, as that will prevent the tablet from dissolving.  Instead place 1-2 drops of water on the table before inserting it in the vagina. If the pills do not dissolve well we can switch to a special compounded suppository. Let me know how you are doing on the medication and if you have any questions.

## 2022-02-04 NOTE — Progress Notes (Signed)
[facility] Return Visit  SUBJECTIVE  History of Present Illness: Linda Porter is a 46 y.o. female seen in follow-up for levator spasm and constipation. Plan at last visit was to start pelvic floor physical therapy.   She did not attend pelvic PT. Still having pain with intercourse.    She has not been using miralax for constipation. Has not been as bad recently- not straining as much. But still only having bowel movements once a week. She had a colonoscopy which was negative.   Past Medical History: Patient  has a past medical history of Allergy, Anemia, Anxiety, Arthritis, Asthma, Eczema, Flat feet, GERD (gastroesophageal reflux disease), Hypertension, Leg cramps, Lumbago, Neuropathy, Seizure ([place]) (06/29/2009), and Vaginal Pap smear, abnormal.   Past Surgical History: She  has a past surgical history that includes Cesarean section; Breast biopsy; Laparoscopic total hysterectomy; Spine surgery (June 2022); and Total abdominal hysterectomy (June 2022).   Medications: She has a current medication list which includes the following prescription(s): albuterol, albuterol, diazepam, phentermine, and trazodone.   Allergies: Patient is allergic to iodine, other, and nerve block tray.   Social History: Patient  reports that she has never smoked. She has never used smokeless tobacco. She reports current alcohol use. She reports that she does not use drugs.      OBJECTIVE     Physical Exam: Vitals:   02/04/22 1126  BP: 132/84  Pulse: (!) 56   Gen: No apparent distress, A&O x 3.  Detailed Urogynecologic Evaluation:  Deferred.    ASSESSMENT AND PLAN    Linda Porter is a 46 y.o. with:  1. Levator spasm   2. Chronic idiopathic constipation    Levator spasm - reviewed option of muscle relaxant and pelvic floor massage - Reviewed video of pelvic wand and how to perform massage of the muscles. Can also use her finger if unable to get a wand.  - Prescribed valium  '5mg'$  to place vaginally up to twice a day as needed for pain. Recommended starting at night to see if she has sedation.    2. Constipation - recommended daily miralax. Can also add in fiber and stool softener if needed  Return 2 months  Jaquita Folds, MD

## 2022-02-18 ENCOUNTER — Other Ambulatory Visit: Payer: Self-pay | Admitting: Medical-Surgical

## 2022-03-04 ENCOUNTER — Ambulatory Visit: Payer: Commercial Managed Care - HMO | Admitting: Medical-Surgical

## 2022-03-07 ENCOUNTER — Encounter: Payer: Self-pay | Admitting: *Deleted

## 2022-03-09 ENCOUNTER — Ambulatory Visit (INDEPENDENT_AMBULATORY_CARE_PROVIDER_SITE_OTHER): Payer: Commercial Managed Care - HMO | Admitting: Medical-Surgical

## 2022-03-09 ENCOUNTER — Encounter: Payer: Self-pay | Admitting: Medical-Surgical

## 2022-03-09 VITALS — BP 134/81 | HR 62 | Ht 62.0 in | Wt 232.0 lb

## 2022-03-09 DIAGNOSIS — Z2821 Immunization not carried out because of patient refusal: Secondary | ICD-10-CM

## 2022-03-09 DIAGNOSIS — I10 Essential (primary) hypertension: Secondary | ICD-10-CM | POA: Diagnosis not present

## 2022-03-09 DIAGNOSIS — M25511 Pain in right shoulder: Secondary | ICD-10-CM

## 2022-03-09 MED ORDER — HYDROCHLOROTHIAZIDE 25 MG PO TABS: 12.5000 mg | ORAL_TABLET | Freq: Every day | ORAL | 1 refills | Status: DC | PRN

## 2022-03-09 MED ORDER — MELOXICAM 15 MG PO TABS: 15.0000 mg | ORAL_TABLET | Freq: Every day | ORAL | 0 refills | Status: DC

## 2022-03-09 NOTE — Progress Notes (Signed)
Established Patient Office Visit  Subjective   Patient ID: Linda Porter, female   DOB: May 25, 1975 Age: 46 y.o. MRN: [id number]   Chief Complaint  Patient presents with   Hypertension   Foot Swelling   Shoulder Pain    right   HPI Pleasant 46 year old female presenting today for the following:  Hypertension: Not currently taking any medications to manage blood pressure although she used to take hydrochlorothiazide.  She does check blood pressures at home on occasion and reports that her readings are similar to today in the 130s/80s.  Notes that since she has been off phentermine, her weight has begun to creep up again although she is exercising with both cardiovascular and strength training exercises.  Does note that her bilateral legs/ankles/feet have been swelling some.  She does have compression socks at home however she reports they hurt her feet since her feet are so flat so she does not wear them as often as she should.  Drinks approximately half a gallon or more of water a day.  Denies chest pain, shortness of breath, headaches, dizziness, syncopal episodes, palpitations.  Right shoulder: Over the last month, she has had right shoulder pain that is described as sharp and stabbing with most range of motion movements.  She has been doing upper body exercises which she reports actually helps with the pain somewhat however it is very temporary relief.  She has not tried anything else as she reports that she is bad at taking medications.  Reports that her range of motion is limited by pain and that if she had to raise her arm, she could do it.   Objective:    Vitals:   03/09/22 0955  BP: 134/81  Pulse: 62  Height: '5\' 2"'$  (1.575 m)  Weight: 232 lb (105.2 kg)  SpO2: 99%  BMI (Calculated): 42.42    Physical Exam Vitals and nursing note reviewed.  Constitutional:      General: She is not in acute distress.    Appearance: Normal appearance. She is not ill-appearing.  HENT:      Head: Normocephalic and atraumatic.  Cardiovascular:     Rate and Rhythm: Normal rate and regular rhythm.     Pulses: Normal pulses.     Heart sounds: Normal heart sounds.  Pulmonary:     Effort: Pulmonary effort is normal. No respiratory distress.     Breath sounds: Normal breath sounds. No wheezing, rhonchi or rales.  Musculoskeletal:     Right shoulder: Tenderness present. Decreased range of motion.     Left shoulder: Normal.       Arms:  Skin:    General: Skin is warm and dry.  Neurological:     Mental Status: She is alert and oriented to person, place, and time.  Psychiatric:        Mood and Affect: Mood normal.        Behavior: Behavior normal.        Thought Content: Thought content normal.        Judgment: Judgment normal.   No results found for this or any previous visit (from the past 24 hour(s)).     The 10-year ASCVD risk score (Arnett DK, et al., 2019) is: 3.1%   Values used to calculate the score:     Age: 39 years     Sex: Female     Is Non-Hispanic African American: Yes     Diabetic: No     Tobacco smoker: No  Systolic Blood Pressure: 546 mmHg     Is BP treated: Yes     HDL Cholesterol: 60 mg/dL     Total Cholesterol: 195 mg/dL   Assessment & Plan:   1. Essential hypertension Blood pressure is borderline today.  With her lower extremity swelling, feel that it is related to venous insufficiency as well as her increase in blood pressure.  Recommend wearing compression socks during the day to reduce swelling.  On exam, she did not have any pitting edema however her ankles did appear puffier than usual.  Recommend following a low-sodium diet and continuing regular intentional exercise with a goal for weight loss.  With her symptoms and borderline pressures today and at home, restarting hydrochlorothiazide 12.5-25 mg daily as needed.  Monitor blood pressure at home with a goal of 130/80 or less.  2. Influenza vaccination declined Declined vaccination  today.  3. Acute pain of right shoulder Suspect possible bursitis versus shoulder impingement.  Starting meloxicam 15 mg daily.  Home exercises printed and discussed with patient.  If no relief of symptoms or improvement in pain level over the next 6 weeks of conservative therapy, recommend returning for further evaluation and possible right shoulder injection with Dr. Darene Lamer.  Return in about 6 months (around 09/08/2022) for HTN follow up.  ___________________________________________ Clearnce Sorrel, DNP, APRN, FNP-BC [facility] and [place]

## 2022-03-12 ENCOUNTER — Other Ambulatory Visit (HOSPITAL_BASED_OUTPATIENT_CLINIC_OR_DEPARTMENT_OTHER): Payer: Self-pay | Admitting: Family Medicine

## 2022-03-12 MED ORDER — HYDROCHLOROTHIAZIDE 25 MG PO TABS: 12.5000 mg | ORAL_TABLET | Freq: Every day | ORAL | 0 refills | Status: AC | PRN

## 2022-03-12 MED ORDER — MELOXICAM 15 MG PO TABS: 15.0000 mg | ORAL_TABLET | Freq: Every day | ORAL | 0 refills | Status: AC

## 2022-03-12 NOTE — Progress Notes (Signed)
Received after hours page. Patient indicated medication not available at pharmacy and after hours nurse called pharmacy to confirm. It does appear medications (meloxicam and HCTZ) were both sent electronically with receipt confirmed by pharmacy on review of medications, but given that they did not show these at pharmacy, new prescriptions were sent for patient.

## 2022-03-24 ENCOUNTER — Encounter: Payer: Self-pay | Admitting: Medical-Surgical

## 2022-03-24 ENCOUNTER — Other Ambulatory Visit: Payer: Self-pay

## 2022-03-24 MED ORDER — ALBUTEROL SULFATE 0.63 MG/3ML IN NEBU: 1.0000 | INHALATION_SOLUTION | Freq: Four times a day (QID) | RESPIRATORY_TRACT | 0 refills | Status: AC | PRN

## 2022-03-24 NOTE — Progress Notes (Signed)
Patient called needing a refill for her Albuterol nebulizer sent to the pharmacy.  This was last filled by a historical provider.  Last office visit 03/09/2022

## 2022-03-24 NOTE — Progress Notes (Signed)
Refill sent. Avoid using nebulizer AND inhaler together.

## 2022-03-25 NOTE — Progress Notes (Signed)
Patient informed. 

## 2022-04-13 ENCOUNTER — Other Ambulatory Visit: Payer: Self-pay | Admitting: Medical-Surgical

## 2022-04-15 ENCOUNTER — Ambulatory Visit: Payer: Commercial Managed Care - HMO | Admitting: Obstetrics and Gynecology

## 2022-08-14 ENCOUNTER — Other Ambulatory Visit: Payer: Self-pay | Admitting: Medical-Surgical

## 2022-09-08 ENCOUNTER — Ambulatory Visit: Payer: Commercial Managed Care - HMO | Admitting: Medical-Surgical

## 2022-09-13 ENCOUNTER — Other Ambulatory Visit: Payer: Self-pay | Admitting: Medical-Surgical

## 2022-12-12 IMAGING — MG MM DIGITAL SCREENING BILAT W/ TOMO AND CAD
8 series · 8 of 24 positions shown · non-contrast
Comparison: Previous exam(s).

CLINICAL DATA: Screening.

EXAM:
DIGITAL SCREENING BILATERAL MAMMOGRAM WITH TOMOSYNTHESIS AND CAD
TECHNIQUE: Bilateral screening digital craniocaudal and mediolateral oblique
mammograms were obtained. Bilateral screening digital breast
tomosynthesis was performed. The images were evaluated with
computer-aided detection.

[L CC synth-2D]
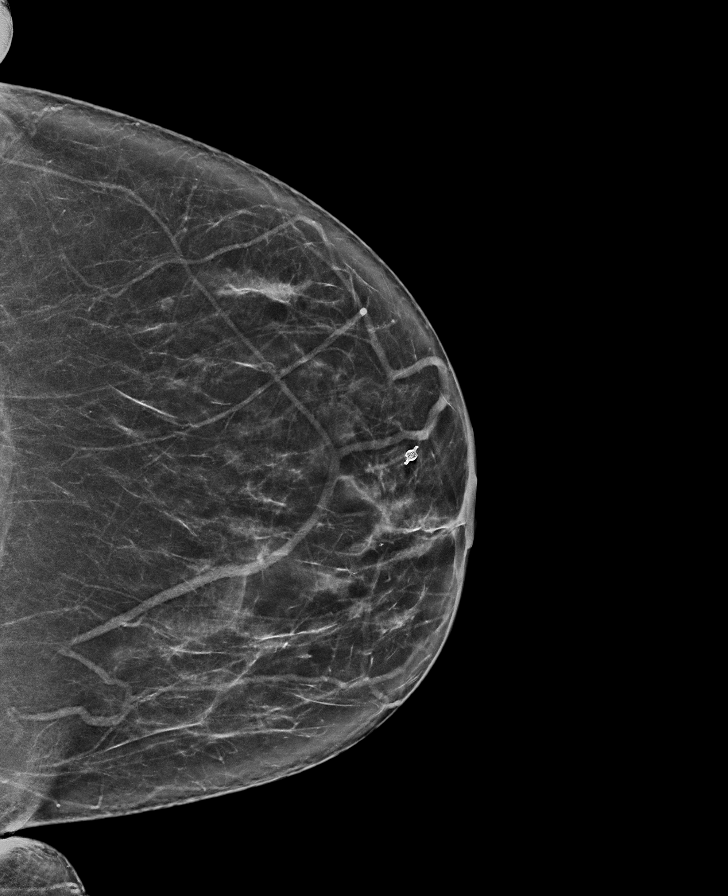

[R MLO synth-2D]
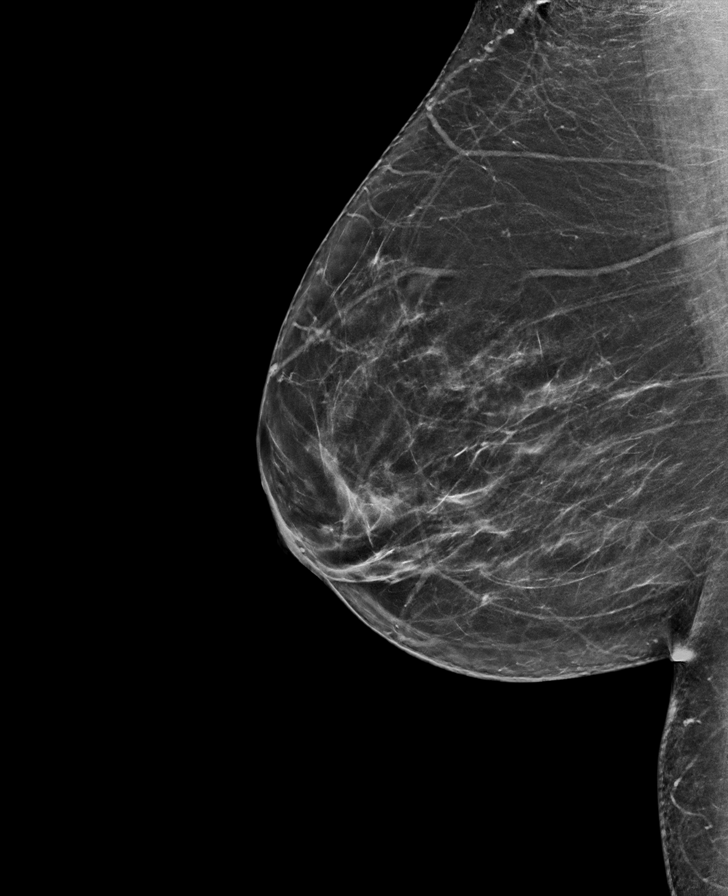

[R CC synth-2D]
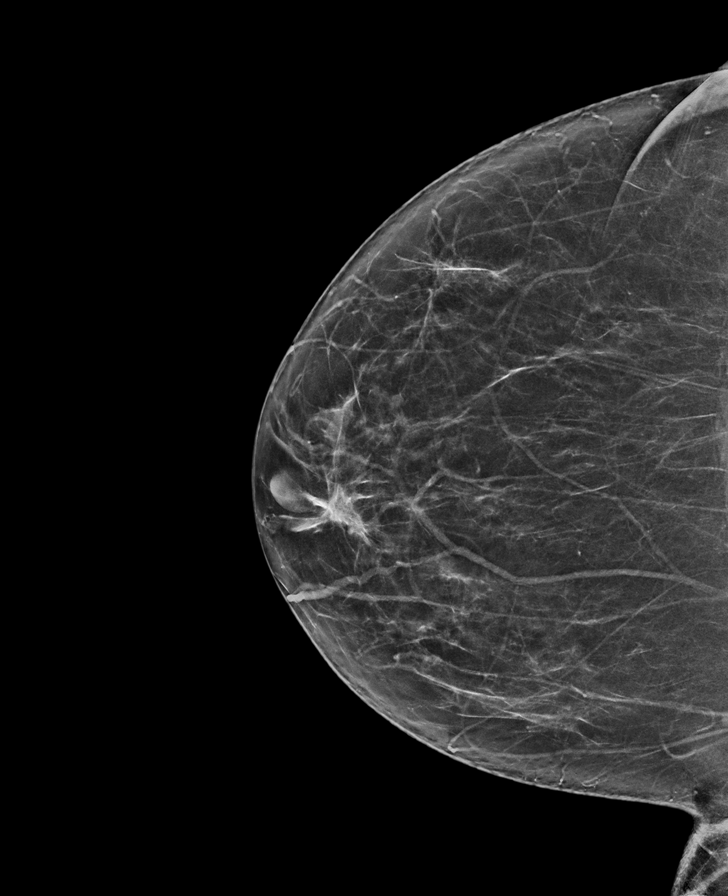

[L MLO synth-2D]
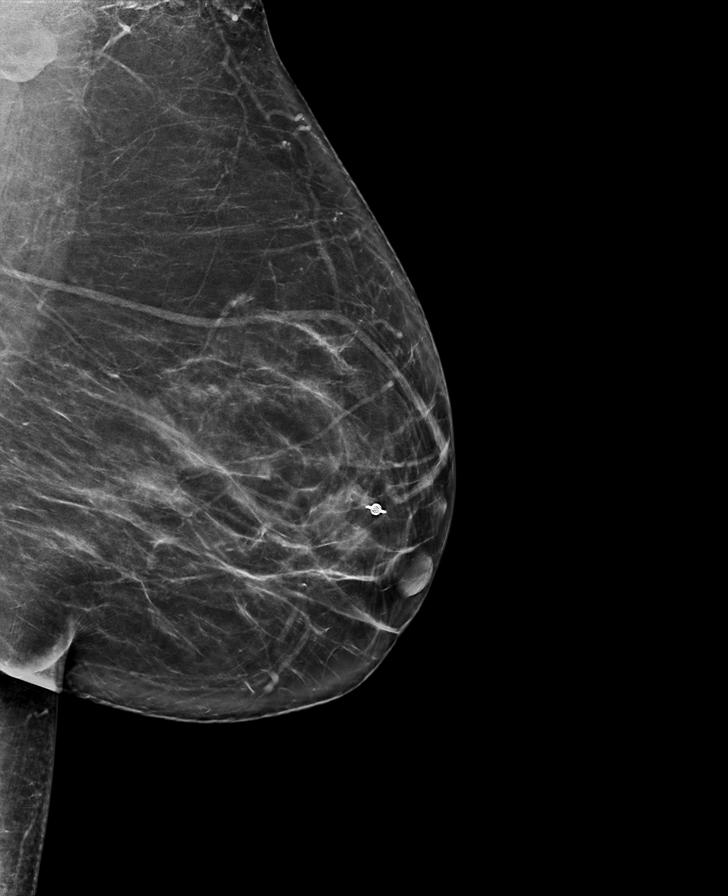

[R CC tomo · tomo slice 34/67.0]
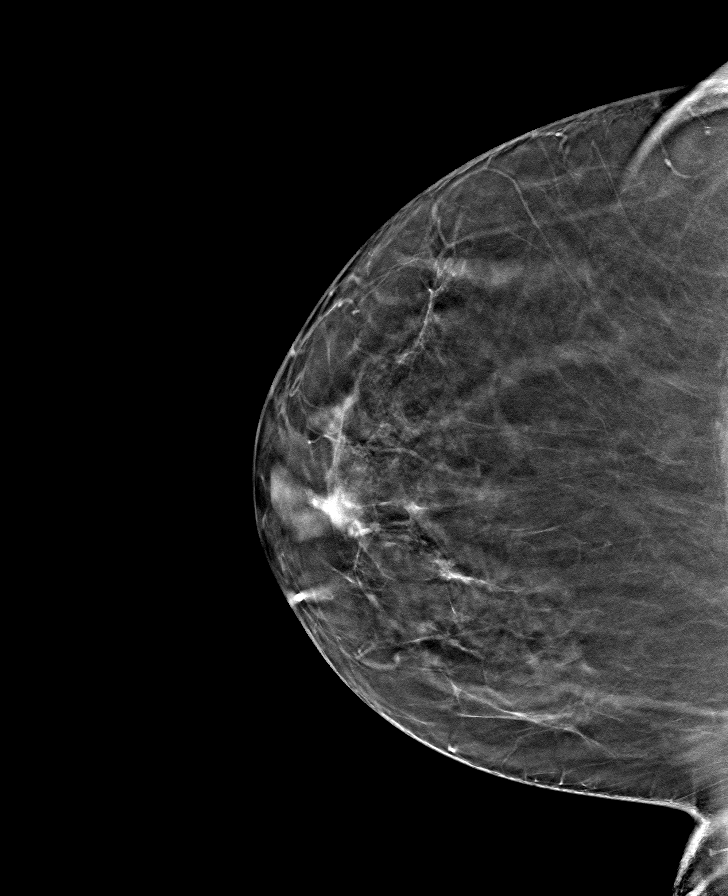

[R MLO tomo · tomo slice 35/70.0]
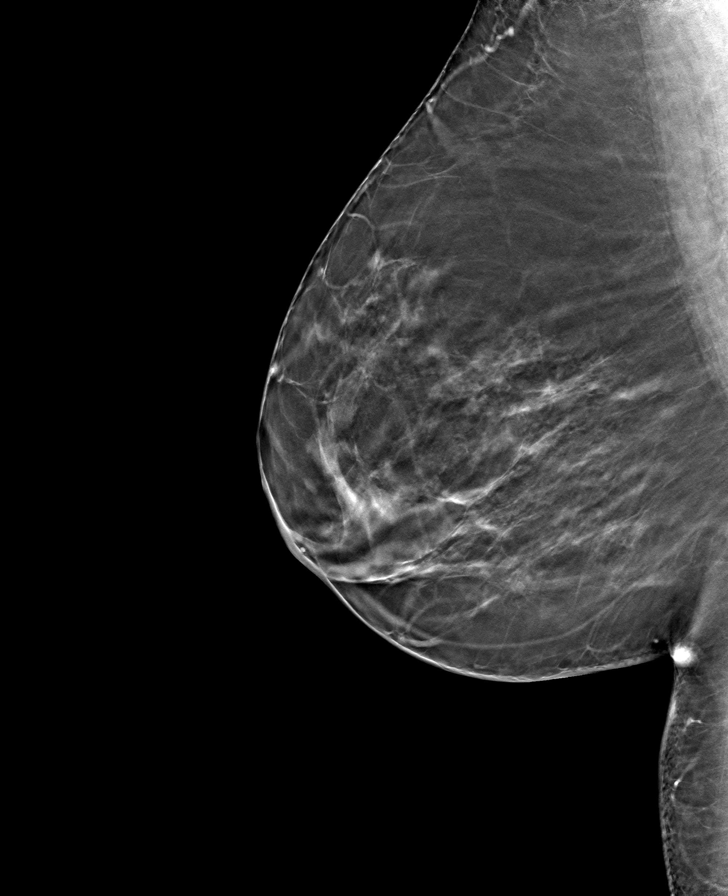

[L CC tomo · tomo slice 35/69.0]
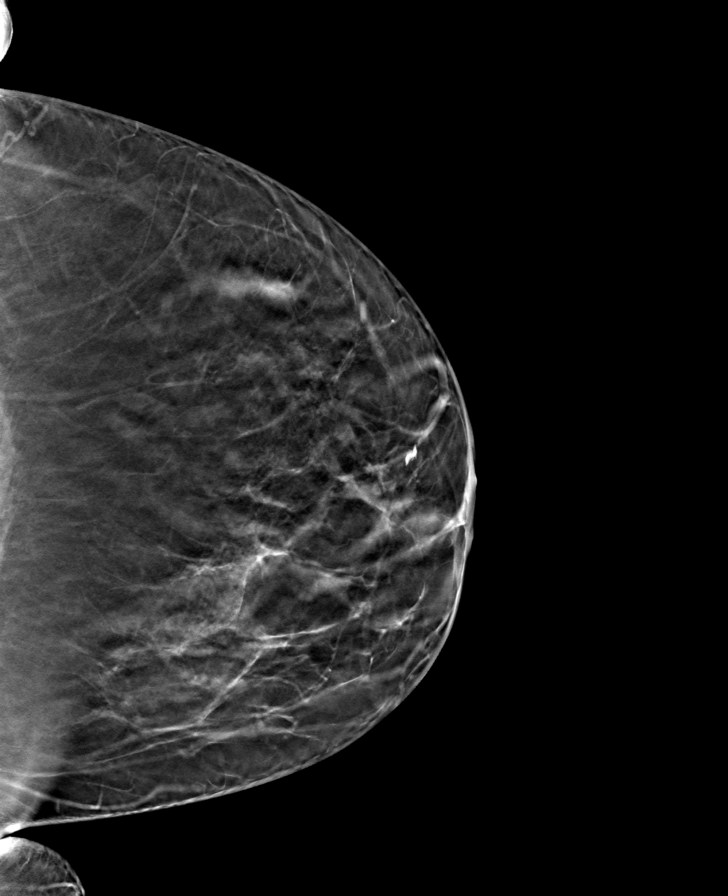

[L MLO tomo · tomo slice 39/78.0]
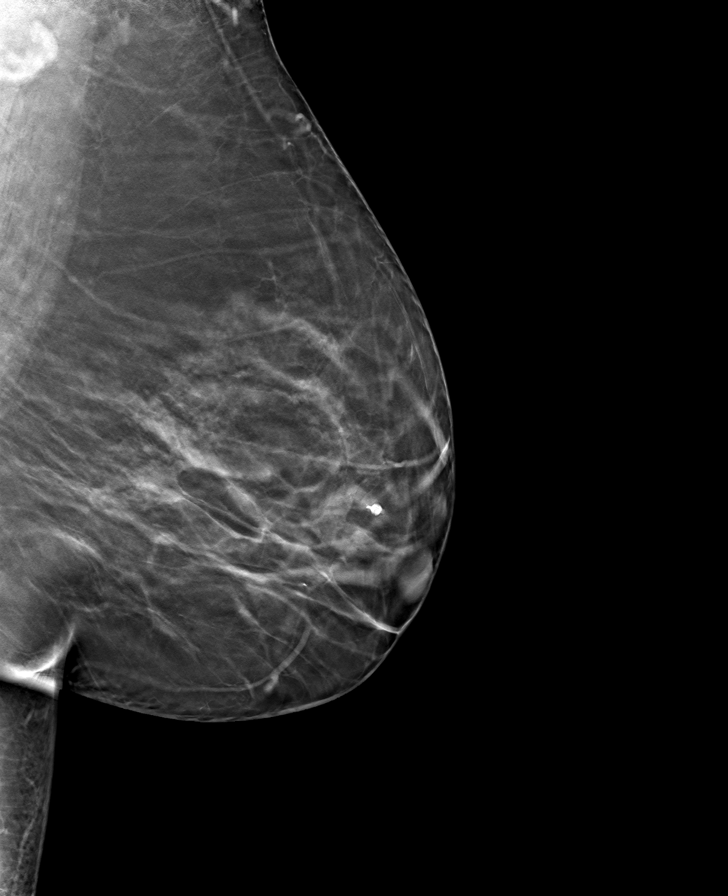

[8 of 24 positions shown; findings below may reference images not displayed]

ACR Breast Density Category b: There are scattered areas of
fibroglandular density.
FINDINGS: There are no findings suspicious for malignancy.
IMPRESSION: No mammographic evidence of malignancy. A result letter of this
screening mammogram will be mailed directly to the patient.

RECOMMENDATION:
Screening mammogram in one year. (Code:51-O-LD2)

BI-RADS CATEGORY  1: Negative.

## 2023-03-12 ENCOUNTER — Other Ambulatory Visit: Payer: Self-pay | Admitting: Medical-Surgical

## 2023-04-09 ENCOUNTER — Other Ambulatory Visit: Payer: Self-pay | Admitting: Medical-Surgical

## 2023-04-29 ENCOUNTER — Other Ambulatory Visit: Payer: Self-pay | Admitting: Medical-Surgical
